# Patient Record
Sex: Male | Born: 1970 | Race: Black or African American | Hispanic: No | Marital: Married | Smoking: Never smoker
Health system: Southern US, Community
[De-identification: ages and names within clinical notes are randomized; demographics above are authoritative.]

## PROBLEM LIST (undated history)

## (undated) DIAGNOSIS — I10 Essential (primary) hypertension: Secondary | ICD-10-CM

## (undated) DIAGNOSIS — Z9889 Other specified postprocedural states: Secondary | ICD-10-CM

## (undated) DIAGNOSIS — J45909 Unspecified asthma, uncomplicated: Secondary | ICD-10-CM

## (undated) DIAGNOSIS — T4145XA Adverse effect of unspecified anesthetic, initial encounter: Secondary | ICD-10-CM

## (undated) DIAGNOSIS — R112 Nausea with vomiting, unspecified: Secondary | ICD-10-CM

## (undated) DIAGNOSIS — T8859XA Other complications of anesthesia, initial encounter: Secondary | ICD-10-CM

## (undated) HISTORY — DX: Unspecified asthma, uncomplicated: J45.909

---
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
## 2005-06-24 ENCOUNTER — Emergency Department: Payer: Self-pay | Admitting: Emergency Medicine

## 2006-04-11 ENCOUNTER — Emergency Department: Payer: Self-pay | Admitting: Unknown Physician Specialty

## 2009-10-27 ENCOUNTER — Emergency Department: Payer: Self-pay | Admitting: Emergency Medicine

## 2010-01-16 ENCOUNTER — Emergency Department: Payer: Self-pay | Admitting: Emergency Medicine

## 2011-03-12 ENCOUNTER — Emergency Department: Payer: Self-pay | Admitting: General Practice

## 2012-10-21 HISTORY — PX: KNEE ARTHROSCOPY: SUR90

## 2013-05-31 ENCOUNTER — Ambulatory Visit: Payer: Self-pay | Admitting: Specialist

## 2013-06-29 ENCOUNTER — Ambulatory Visit: Payer: Self-pay | Admitting: Specialist

## 2013-08-14 ENCOUNTER — Emergency Department: Payer: Self-pay | Admitting: Emergency Medicine

## 2013-09-28 ENCOUNTER — Ambulatory Visit: Payer: Self-pay | Admitting: Specialist

## 2013-09-28 LAB — POTASSIUM: Potassium: 4.1 mmol/L (ref 3.5–5.1)

## 2013-10-05 ENCOUNTER — Ambulatory Visit: Payer: Self-pay | Admitting: Specialist

## 2013-12-10 IMAGING — NM NM BONE LIMITED
1 series · 4 of 4 positions shown · non-contrast
Comparison: none

REASON FOR EXAM: abn MRI L  knee pain eval for stress fracture
COMMENTS:

[Series 1000: bone statics · 2.40mm/px · 2 acquisitions, 4 frames shown]
[im 1/2]
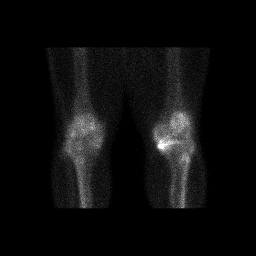
[im 1/2]
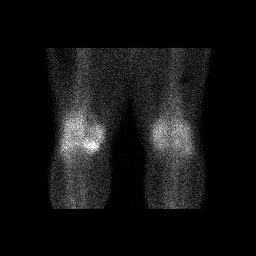
[im 2/2]
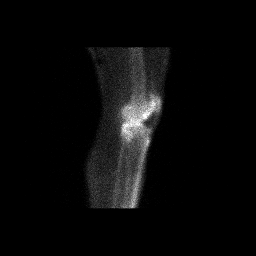
[im 2/2]
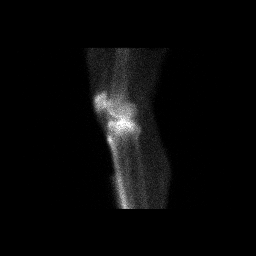

[4 of 4 positions shown; findings below may reference images not displayed]

PROCEDURE:     KNM - KNM LIMTED BONE SCAN SINGLE AREA  - June 29, 2013  [DATE]

RESULT:     A limited nuclear bone scan was performed over the knees.

The patient received 22.87 mCi of technetium 99 M labeled MDP for this study.

There is adequate uptake of the radiopharmaceutical by the skeleton.
Adequate soft tissue clearance is seen. There is increased uptake in the
medial aspect of the question of the posterior medial aspect of the tibial
metaphysis corresponding to a very mildly increased signal on the T2
weighted images in the medial tibial metaphysis. There is no abnormal uptake
in the area of clinical concern in the medial aspect of the
diaphyseal-metaphyseal junction of the distal left femur.
IMPRESSION: There is no abnormal uptake of the radiopharmaceutical in
the area of clinical concern in the diaphyseal-metaphyseal junction medially
in the left femur.

[REDACTED]

## 2015-02-10 NOTE — Op Note (Signed)
PATIENT NAME:  Bryan Stephenson, Bryan Stephenson MR#:  [id number] DATE OF BIRTH:  1971-02-02  DATE OF PROCEDURE:  10/05/2013  PREOPERATIVE DIAGNOSIS: Medial meniscus tear, left knee.   PREOPERATIVE DIAGNOSIS: Complex tear medial meniscus posterior horn, left knee.   PROCEDURE: Arthroscopic partial medial meniscectomy, left knee.   SURGEON: Myra Rudehristopher Cisar, M.D.   ANESTHESIA: General.   COMPLICATIONS: None.   ESTIMATED BLOOD LOSS: None.   DESCRIPTION OF PROCEDURE: After adequate induction of general anesthesia, the left lower extremity is placed in the leg holder in the usual manner for arthroscopy. The left knee and leg are thoroughly prepped with alcohol and ChloraPrep and draped in standard sterile fashion. The joint is infiltrated with 10 mL of Marcaine with epinephrine. Diagnostic arthroscopy is performed. The patellofemoral articulation is normal. There is minimal synovitis present. The anterior cruciate ligament is normal. The lateral compartment is normal with normal cartilaginous surfaces and a normal lateral meniscus. The pathology is confined to the medial compartment where there is seen to be a complex tear of the very posterior horn of the medial meniscus. There is mild chondromalacia of the tibial articular surface and the femoral condyle surface appears normal. Using a combination of the full radial resector and the ArthroWand, the torn portion of the meniscus is resected back to a stable rim. The probe was used to demonstrate that no further evidence of tear is seen. The joint is thoroughly irrigated multiple times until clear. Portals are closed with 5-0 nylon. The joint is infiltrated with 10 mL of Marcaine with epinephrine with 4 mg of morphine. A soft bulky dressing is applied. The patient is returned to the recovery room in satisfactory condition having tolerated the procedure quite well.  ____________________________ Clare Gandyhristopher E. Ruggles, MD ces:aw D: 10/05/2013 09:27:10  ET T: 10/05/2013 09:41:32 ET JOB#: [id number]  cc: Clare Gandyhristopher E. Beattie, MD, <Dictator> Clare GandyHRISTOPHER E Scrogham MD ELECTRONICALLY SIGNED 10/07/2013 12:32

## 2017-10-08 ENCOUNTER — Other Ambulatory Visit: Payer: Self-pay | Admitting: Orthopedic Surgery

## 2017-10-16 ENCOUNTER — Other Ambulatory Visit: Payer: Self-pay

## 2017-10-16 ENCOUNTER — Encounter
Admission: RE | Admit: 2017-10-16 | Discharge: 2017-10-16 | Disposition: A | Discharge status: Home / Self Care | Payer: Self-pay | Source: Ambulatory Visit | Attending: Orthopedic Surgery | Admitting: Orthopedic Surgery

## 2017-10-16 HISTORY — DX: Essential (primary) hypertension: I10

## 2017-10-16 HISTORY — DX: Other complications of anesthesia, initial encounter: T88.59XA

## 2017-10-16 HISTORY — DX: Adverse effect of unspecified anesthetic, initial encounter: T41.45XA

## 2017-10-16 HISTORY — DX: Other specified postprocedural states: Z98.890

## 2017-10-16 HISTORY — DX: Nausea with vomiting, unspecified: R11.2

## 2017-10-16 NOTE — Patient Instructions (Signed)
  Your procedure is scheduled on: 10-23-16 THURSDAY Report to Same Day Surgery 2nd floor medical mall [facility](Medical Mall Entrance-take elevator on left to 2nd floor.  Check in with surgery information desk.) To find out your arrival time please call [phone] between 1PM - 3PM [facility]  Remember: Instructions that are not followed completely may result in serious medical risk, up to and including death, or upon the discretion of your surgeon and anesthesiologist your surgery may need to be rescheduled.    _x___ 1. Do not eat food after midnight the night before your procedure. NO GUM OR CANDY AFTER MIDNIGHT.  You may drink clear liquids up to 2 hours before you are scheduled to arrive at the hospital for your procedure.  Do not drink clear liquids within 2 hours of your scheduled arrival to the hospital.  Clear liquids include  --Water or Apple juice without pulp  --Clear carbohydrate beverage such as ClearFast or Gatorade  --Black Coffee or Clear Tea (No milk, no creamers, do not add anything to  the coffee or Tea     __x__ 2. No Alcohol for 24 hours before or after surgery.   __x__3. No Smoking for 24 prior to surgery.   ____  4. Bring all medications with you on the day of surgery if instructed.    __x__ 5. Notify your doctor if there is any change in your medical condition     (cold, fever, infections).     Do not wear jewelry, make-up, hairpins, clips or nail polish.  Do not wear lotions, powders, or perfumes. You may wear deodorant.  Do not shave 48 hours prior to surgery. Men may shave face and neck.  Do not bring valuables to the hospital.    [facility] any belongings or valuables.               Contacts, dentures or bridgework may not be worn into surgery.  Leave your suitcase in the car. After surgery it may be brought to your room.  For patients admitted to the hospital, discharge time is determined by your treatment team.   Patients  discharged the day of surgery will not be allowed to drive home.  You will need someone to drive you home and stay with you the night of your procedure.    Please read over the following fact sheets that you were given:   [facility] Pre[facility] and or MRSA Information   ____ TAKE THE FOLLOWING MEDICATION THE MORNING OF SURGERY WITH A SMALL SIP OF WATER. These include:  1. NONE  2.  3.  4.  5.  6.  ____Fleets enema or Magnesium Citrate as directed.   ____ Use CHG Soap or sage wipes as directed on instruction sheet   ____ Use inhalers on the day of surgery and bring to hospital day of surgery  ____ Stop Metformin and Janumet 2 days prior to surgery.    ____ Take 1/2 of usual insulin dose the night before surgery and none on the morning surgery.   ____ Follow recommendations from Cardiologist, Pulmonologist or PCP regarding stopping Aspirin, Coumadin, Plavix ,Eliquis, Effient, or Pradaxa, and Pletal.  X____Stop Anti-inflammatories such as Advil, Aleve, Ibuprofen, Motrin, Naproxen, Naprosyn, Goodies powders or aspirin products NOW-OK to take Tylenol    ____ Stop supplements until after surgery.     ____ Bring C-Pap to the hospital.

## 2017-10-20 ENCOUNTER — Inpatient Hospital Stay: Admission: RE | Admit: 2017-10-20 | Payer: Self-pay | Source: Ambulatory Visit

## 2017-10-22 ENCOUNTER — Encounter
Admission: RE | Admit: 2017-10-22 | Discharge: 2017-10-22 | Disposition: A | Discharge status: Home / Self Care | Payer: Worker's Compensation | Source: Ambulatory Visit | Attending: Orthopedic Surgery | Admitting: Orthopedic Surgery

## 2017-10-22 ENCOUNTER — Other Ambulatory Visit: Payer: Self-pay

## 2017-10-22 DIAGNOSIS — S83241A Other tear of medial meniscus, current injury, right knee, initial encounter: Secondary | ICD-10-CM | POA: Diagnosis present

## 2017-10-22 DIAGNOSIS — Z79899 Other long term (current) drug therapy: Secondary | ICD-10-CM | POA: Diagnosis not present

## 2017-10-22 DIAGNOSIS — E669 Obesity, unspecified: Secondary | ICD-10-CM | POA: Diagnosis not present

## 2017-10-22 DIAGNOSIS — I10 Essential (primary) hypertension: Secondary | ICD-10-CM | POA: Diagnosis not present

## 2017-10-22 DIAGNOSIS — Z6833 Body mass index (BMI) 33.0-33.9, adult: Secondary | ICD-10-CM | POA: Diagnosis not present

## 2017-10-22 DIAGNOSIS — X58XXXA Exposure to other specified factors, initial encounter: Secondary | ICD-10-CM | POA: Diagnosis not present

## 2017-10-22 LAB — BASIC METABOLIC PANEL
Anion gap: 8 (ref 5–15)
BUN: 16 mg/dL (ref 6–20)
CALCIUM: 9.1 mg/dL (ref 8.9–10.3)
CO2: 27 mmol/L (ref 22–32)
CREATININE: 1.18 mg/dL (ref 0.61–1.24)
Chloride: 105 mmol/L (ref 101–111)
GFR calc non Af Amer: >60 mL/min (ref >60)
Glucose, Bld: 96 mg/dL (ref 65–99)
Potassium: 4.1 mmol/L (ref 3.5–5.1)
SODIUM: 140 mmol/L (ref 135–145)

## 2017-10-22 LAB — CBC WITH DIFFERENTIAL/PLATELET
BASOS PCT: 1 %
Basophils Absolute: 0.1 10*3/uL (ref 0–0.1)
EOS ABS: 0.3 10*3/uL (ref 0–0.7)
EOS PCT: 4 %
HCT: 41.7 % (ref 40.0–52.0)
HEMOGLOBIN: 13.7 g/dL (ref 13.0–18.0)
Lymphocytes Relative: 26 %
Lymphs Abs: 1.9 10*3/uL (ref 1.0–3.6)
MCH: 27.8 pg (ref 26.0–34.0)
MCHC: 32.9 g/dL (ref 32.0–36.0)
MCV: 84.4 fL (ref 80.0–100.0)
MONOS PCT: 6 %
Monocytes Absolute: 0.4 10*3/uL (ref 0.2–1.0)
NEUTROS PCT: 63 %
Neutro Abs: 4.4 10*3/uL (ref 1.4–6.5)
PLATELETS: 239 10*3/uL (ref 150–440)
RBC: 4.94 MIL/uL (ref 4.40–5.90)
RDW: 12.5 % (ref 11.5–14.5)
WBC: 7.1 10*3/uL (ref 3.8–10.6)

## 2017-10-22 LAB — PROTIME-INR
INR: 1.05
PROTHROMBIN TIME: 13.6 s (ref 11.4–15.2)

## 2017-10-22 LAB — APTT: aPTT: 32 seconds (ref 24–36)

## 2017-10-22 MED ORDER — CEFAZOLIN SODIUM-DEXTROSE 2-4 GM/100ML-% IV SOLN
2.0000 g | INTRAVENOUS | Status: AC
  Administered 2017-10-23: 2 g via INTRAVENOUS

## 2017-10-23 ENCOUNTER — Ambulatory Visit
Admission: RE | Admit: 2017-10-23 | Discharge: 2017-10-23 | Disposition: A | Discharge status: Home / Self Care | Payer: Worker's Compensation | Source: Ambulatory Visit | Attending: Orthopedic Surgery | Admitting: Orthopedic Surgery

## 2017-10-23 ENCOUNTER — Ambulatory Visit: Payer: Worker's Compensation | Admitting: Anesthesiology

## 2017-10-23 ENCOUNTER — Encounter
Admission: RE | Disposition: A | Discharge status: Home / Self Care | Payer: Self-pay | Source: Ambulatory Visit | Attending: Orthopedic Surgery

## 2017-10-23 ENCOUNTER — Encounter: Payer: Self-pay | Admitting: *Deleted

## 2017-10-23 DIAGNOSIS — I10 Essential (primary) hypertension: Secondary | ICD-10-CM | POA: Insufficient documentation

## 2017-10-23 DIAGNOSIS — X58XXXA Exposure to other specified factors, initial encounter: Secondary | ICD-10-CM | POA: Insufficient documentation

## 2017-10-23 DIAGNOSIS — E669 Obesity, unspecified: Secondary | ICD-10-CM | POA: Insufficient documentation

## 2017-10-23 DIAGNOSIS — S83241A Other tear of medial meniscus, current injury, right knee, initial encounter: Secondary | ICD-10-CM | POA: Insufficient documentation

## 2017-10-23 DIAGNOSIS — Z79899 Other long term (current) drug therapy: Secondary | ICD-10-CM | POA: Insufficient documentation

## 2017-10-23 DIAGNOSIS — Z6833 Body mass index (BMI) 33.0-33.9, adult: Secondary | ICD-10-CM | POA: Insufficient documentation

## 2017-10-23 HISTORY — PX: KNEE ARTHROSCOPY WITH MEDIAL MENISECTOMY: SHX5651

## 2017-10-23 SURGERY — ARTHROSCOPY, KNEE, WITH MEDIAL MENISCECTOMY
Anesthesia: General | Site: Knee | Laterality: Right | Wound class: Clean

## 2017-10-23 MED ORDER — ONDANSETRON HCL 4 MG/2ML IJ SOLN
INTRAMUSCULAR | Status: AC
  Filled 2017-10-23: qty 2

## 2017-10-23 MED ORDER — PROMETHAZINE HCL 25 MG/ML IJ SOLN: 6.2500 mg | INTRAMUSCULAR | Status: DC | PRN

## 2017-10-23 MED ORDER — MIDAZOLAM HCL 5 MG/5ML IJ SOLN
INTRAMUSCULAR | Status: DC | PRN
  Administered 2017-10-23: 2 mg via INTRAVENOUS

## 2017-10-23 MED ORDER — MIDAZOLAM HCL 2 MG/2ML IJ SOLN
INTRAMUSCULAR | Status: AC
  Filled 2017-10-23: qty 2

## 2017-10-23 MED ORDER — FENTANYL CITRATE (PF) 100 MCG/2ML IJ SOLN
INTRAMUSCULAR | Status: AC
  Filled 2017-10-23: qty 2

## 2017-10-23 MED ORDER — LIDOCAINE HCL (PF) 1 % IJ SOLN
INTRAMUSCULAR | Status: AC
  Filled 2017-10-23: qty 30

## 2017-10-23 MED ORDER — DEXAMETHASONE SODIUM PHOSPHATE 10 MG/ML IJ SOLN
INTRAMUSCULAR | Status: DC | PRN
  Administered 2017-10-23: 10 mg via INTRAVENOUS

## 2017-10-23 MED ORDER — OXYCODONE HCL 5 MG/5ML PO SOLN: 5.0000 mg | Freq: Once | ORAL | Status: DC | PRN

## 2017-10-23 MED ORDER — BUPIVACAINE-EPINEPHRINE (PF) 0.25% -1:200000 IJ SOLN
INTRAMUSCULAR | Status: DC | PRN
  Administered 2017-10-23: 30 mL

## 2017-10-23 MED ORDER — PROPOFOL 10 MG/ML IV BOLUS
INTRAVENOUS | Status: AC
  Filled 2017-10-23: qty 20

## 2017-10-23 MED ORDER — FAMOTIDINE 20 MG PO TABS
20.0000 mg | ORAL_TABLET | Freq: Once | ORAL | Status: AC
  Administered 2017-10-23: 20 mg via ORAL

## 2017-10-23 MED ORDER — HYDROCODONE-ACETAMINOPHEN 5-325 MG PO TABS: 1.0000 | ORAL_TABLET | ORAL | 0 refills | Status: DC | PRN

## 2017-10-23 MED ORDER — LIDOCAINE 2% (20 MG/ML) 5 ML SYRINGE
INTRAMUSCULAR | Status: DC | PRN
  Administered 2017-10-23: 100 mg via INTRAVENOUS

## 2017-10-23 MED ORDER — FAMOTIDINE 20 MG PO TABS
ORAL_TABLET | ORAL | Status: AC
  Administered 2017-10-23: 20 mg via ORAL
  Filled 2017-10-23: qty 1

## 2017-10-23 MED ORDER — LIDOCAINE HCL (PF) 1 % IJ SOLN
INTRAMUSCULAR | Status: DC | PRN
  Administered 2017-10-23: 13 mL

## 2017-10-23 MED ORDER — SCOPOLAMINE 1 MG/3DAYS TD PT72
MEDICATED_PATCH | TRANSDERMAL | Status: AC
  Administered 2017-10-23: 1.5 mg
  Filled 2017-10-23: qty 1

## 2017-10-23 MED ORDER — ACETAMINOPHEN 10 MG/ML IV SOLN
INTRAVENOUS | Status: AC
  Filled 2017-10-23: qty 100

## 2017-10-23 MED ORDER — PROPOFOL 10 MG/ML IV BOLUS
INTRAVENOUS | Status: DC | PRN
  Administered 2017-10-23: 200 mg via INTRAVENOUS
  Administered 2017-10-23: 50 mg via INTRAVENOUS

## 2017-10-23 MED ORDER — BUPIVACAINE-EPINEPHRINE (PF) 0.25% -1:200000 IJ SOLN
INTRAMUSCULAR | Status: AC
  Filled 2017-10-23: qty 30

## 2017-10-23 MED ORDER — ONDANSETRON HCL 4 MG/2ML IJ SOLN
INTRAMUSCULAR | Status: DC | PRN
  Administered 2017-10-23: 4 mg via INTRAVENOUS

## 2017-10-23 MED ORDER — GLYCOPYRROLATE 0.2 MG/ML IJ SOLN
INTRAMUSCULAR | Status: AC
  Filled 2017-10-23: qty 1

## 2017-10-23 MED ORDER — LACTATED RINGERS IV SOLN
INTRAVENOUS | Status: DC
  Administered 2017-10-23: 50 mL/h via INTRAVENOUS

## 2017-10-23 MED ORDER — LIDOCAINE HCL (PF) 2 % IJ SOLN
INTRAMUSCULAR | Status: AC
  Filled 2017-10-23: qty 10

## 2017-10-23 MED ORDER — CHLORHEXIDINE GLUCONATE CLOTH 2 % EX PADS: 6.0000 | MEDICATED_PAD | Freq: Once | CUTANEOUS | Status: DC

## 2017-10-23 MED ORDER — KETOROLAC TROMETHAMINE 30 MG/ML IJ SOLN
INTRAMUSCULAR | Status: DC | PRN
  Administered 2017-10-23: 30 mg via INTRAVENOUS

## 2017-10-23 MED ORDER — OXYCODONE HCL 5 MG PO TABS: 5.0000 mg | ORAL_TABLET | Freq: Once | ORAL | Status: DC | PRN

## 2017-10-23 MED ORDER — ONDANSETRON HCL 4 MG PO TABS: 4.0000 mg | ORAL_TABLET | Freq: Three times a day (TID) | ORAL | 0 refills | Status: DC | PRN

## 2017-10-23 MED ORDER — PROPOFOL 500 MG/50ML IV EMUL
INTRAVENOUS | Status: DC | PRN
  Administered 2017-10-23: 125 ug/kg/min via INTRAVENOUS

## 2017-10-23 MED ORDER — CEFAZOLIN SODIUM-DEXTROSE 2-4 GM/100ML-% IV SOLN
INTRAVENOUS | Status: AC
  Filled 2017-10-23: qty 100

## 2017-10-23 MED ORDER — PROPOFOL 500 MG/50ML IV EMUL
INTRAVENOUS | Status: AC
  Filled 2017-10-23: qty 100

## 2017-10-23 MED ORDER — KETOROLAC TROMETHAMINE 30 MG/ML IJ SOLN
INTRAMUSCULAR | Status: AC
  Filled 2017-10-23: qty 1

## 2017-10-23 MED ORDER — BISOPROLOL FUMARATE 5 MG PO TABS
5.0000 mg | ORAL_TABLET | Freq: Once | ORAL | Status: AC
  Administered 2017-10-23: 5 mg via ORAL
  Filled 2017-10-23: qty 1

## 2017-10-23 MED ORDER — MEPERIDINE HCL 50 MG/ML IJ SOLN: 6.2500 mg | INTRAMUSCULAR | Status: DC | PRN

## 2017-10-23 MED ORDER — ASPIRIN EC 325 MG PO TBEC: 325.0000 mg | DELAYED_RELEASE_TABLET | Freq: Every day | ORAL | 0 refills | Status: DC

## 2017-10-23 MED ORDER — FENTANYL CITRATE (PF) 100 MCG/2ML IJ SOLN: 25.0000 ug | INTRAMUSCULAR | Status: DC | PRN

## 2017-10-23 MED ORDER — ACETAMINOPHEN 10 MG/ML IV SOLN
INTRAVENOUS | Status: DC | PRN
  Administered 2017-10-23: 1000 mg via INTRAVENOUS

## 2017-10-23 MED ORDER — DEXAMETHASONE SODIUM PHOSPHATE 10 MG/ML IJ SOLN
INTRAMUSCULAR | Status: AC
  Filled 2017-10-23: qty 1

## 2017-10-23 MED ORDER — DEXMEDETOMIDINE HCL 200 MCG/2ML IV SOLN
INTRAVENOUS | Status: DC | PRN
  Administered 2017-10-23: 8 ug via INTRAVENOUS

## 2017-10-23 MED ORDER — GLYCOPYRROLATE 0.2 MG/ML IJ SOLN
INTRAMUSCULAR | Status: DC | PRN
  Administered 2017-10-23: 0.2 mg via INTRAVENOUS

## 2017-10-23 MED ORDER — FENTANYL CITRATE (PF) 100 MCG/2ML IJ SOLN
INTRAMUSCULAR | Status: DC | PRN
  Administered 2017-10-23 (×3): 50 ug via INTRAVENOUS

## 2017-10-23 MED ORDER — SCOPOLAMINE 1 MG/3DAYS TD PT72: 1.0000 | MEDICATED_PATCH | TRANSDERMAL | Status: DC

## 2017-10-23 SURGICAL SUPPLY — 43 items
ADAPTER IRRIG TUBE 2 SPIKE SOL (ADAPTER) ×6 IMPLANT
BUR RADIUS 3.5 (BURR) ×3 IMPLANT
BUR RADIUS 4.0X18.5 (BURR) ×3 IMPLANT
CANISTER SUCT LVC 12 LTR MEDI- (MISCELLANEOUS) IMPLANT
CHLORAPREP W/TINT 26ML (MISCELLANEOUS) ×9 IMPLANT
CLOSURE WOUND 1/2 X4 (GAUZE/BANDAGES/DRESSINGS) ×1
COOLER POLAR GLACIER W/PUMP (MISCELLANEOUS) IMPLANT
CUFF TOURN 24 STER (MISCELLANEOUS) IMPLANT
CUFF TOURN 30 STER DUAL PORT (MISCELLANEOUS) ×3 IMPLANT
DEVICE SUCT BLK HOLE OR FLOOR (MISCELLANEOUS) IMPLANT
DRAPE IMP U-DRAPE 54X76 (DRAPES) ×3 IMPLANT
DURAPREP 26ML APPLICATOR (WOUND CARE) IMPLANT
GAUZE PETRO XEROFOAM 1X8 (MISCELLANEOUS) IMPLANT
GAUZE SPONGE 4X4 12PLY STRL (GAUZE/BANDAGES/DRESSINGS) ×3 IMPLANT
GLOVE BIOGEL PI IND STRL 7.0 (GLOVE) ×4 IMPLANT
GLOVE BIOGEL PI IND STRL 9 (GLOVE) ×1 IMPLANT
GLOVE BIOGEL PI INDICATOR 7.0 (GLOVE) ×8
GLOVE BIOGEL PI INDICATOR 9 (GLOVE) ×2
GLOVE SURG 9.0 ORTHO LTXF (GLOVE) ×6 IMPLANT
GOWN STRL REUS TWL 2XL XL LVL4 (GOWN DISPOSABLE) ×3 IMPLANT
GOWN STRL REUS W/ TWL LRG LVL3 (GOWN DISPOSABLE) ×1 IMPLANT
GOWN STRL REUS W/TWL LRG LVL3 (GOWN DISPOSABLE) ×2
IV LACTATED RINGER IRRG 3000ML (IV SOLUTION) ×20
IV LR IRRIG 3000ML ARTHROMATIC (IV SOLUTION) ×10 IMPLANT
KIT RM TURNOVER STRD PROC AR (KITS) ×3 IMPLANT
MANIFOLD NEPTUNE II (INSTRUMENTS) ×3 IMPLANT
MAT BLUE FLOOR 46X72 FLO (MISCELLANEOUS) ×6 IMPLANT
NEEDLE HYPO 22GX1.5 SAFETY (NEEDLE) ×3 IMPLANT
PACK ARTHROSCOPY KNEE (MISCELLANEOUS) ×3 IMPLANT
PAD ABD DERMACEA PRESS 5X9 (GAUZE/BANDAGES/DRESSINGS) ×6 IMPLANT
PAD WRAPON POLAR KNEE (MISCELLANEOUS) ×1 IMPLANT
SET TUBE SUCT SHAVER OUTFL 24K (TUBING) ×3 IMPLANT
SET TUBE TIP INTRA-ARTICULAR (MISCELLANEOUS) ×3 IMPLANT
SOL PREP PVP 2OZ (MISCELLANEOUS)
SOLUTION PREP PVP 2OZ (MISCELLANEOUS) IMPLANT
STRIP CLOSURE SKIN 1/2X4 (GAUZE/BANDAGES/DRESSINGS) ×2 IMPLANT
SUT ETHILON 4-0 (SUTURE) ×2
SUT ETHILON 4-0 FS2 18XMFL BLK (SUTURE) ×1
SUTURE ETHLN 4-0 FS2 18XMF BLK (SUTURE) ×1 IMPLANT
TUBING ARTHRO INFLOW-ONLY STRL (TUBING) ×3 IMPLANT
WAND HAND CNTRL MULTIVAC 50 (MISCELLANEOUS) IMPLANT
WAND HAND CNTRL MULTIVAC 90 (MISCELLANEOUS) ×3 IMPLANT
WRAPON POLAR PAD KNEE (MISCELLANEOUS) ×3

## 2017-10-23 NOTE — H&P (Signed)
The patient has been re-examined, and the chart reviewed, and there have been no interval changes to the documented history and physical.    The risks, benefits, and alternatives have been discussed at length, and the patient is willing to proceed.   

## 2017-10-23 NOTE — Op Note (Signed)
PATIENT:  Bryan Stephenson  PRE-OPERATIVE DIAGNOSIS:  TEAR OF MEDIAL MENISCUS, RIGHT KNEE  POST-OPERATIVE DIAGNOSIS:    PROCEDURE:  RIGHT KNEE ARTHROSCOPY WITH  Partial MEDIAL AND LATERAL MENISECTOMIES, SYNOVECTOMY AND CHONDROPLASTY OF THE UNDERSURFACE OF THE PATELLA, TROCHLEA AND MEDIAL FEMORAL CONDYLE  SURGEON:  Thornton Park, MD  ANESTHESIA:   General  PREOPERATIVE INDICATIONS:  Bryan Stephenson  47 y.o. male with a diagnosis of TEAR OF MEDIAL MENISCUS, RIGHT KNEE who failed conservative management and elected for surgical management.    The risks benefits and alternatives were discussed with the patient preoperatively including the risks of infection, bleeding, nerve injury, knee stiffness, persistent pain, osteoarthritis and the need for further surgery. Medical  risks include DVT and pulmonary embolism, myocardial infarction, stroke, pneumonia, respiratory failure and death. The patient understood these risks and wished to proceed.  OPERATIVE FINDINGS: Tear of the posterior horn of the medial meniscus, extensive chondral wear of the femoral trochlea, chondral fraying of the undersurface of the patella and small chondral fissuring involving the medial femoral condyle.  OPERATIVE PROCEDURE: Patient was met in the preoperative area. His wife was at the bedside. The operative extremity was signed with the word yes and my initials according the hospital's correct site of surgery protocol.  I answered all their questions.  The patient was brought to the operating room where they was placed supine on the operative table. General anesthesia was administered. The patient was prepped and draped in a sterile fashion.  A timeout was performed to verify the patient's name, date of birth, medical record number, correct site of surgery correct procedure to be performed. It was also used to verify the patient received antibiotics that all appropriate instruments, and radiographic studies were  available in the room. Once all in attendance were in agreement, the case began.  Proposed arthroscopy incisions were drawn out with a surgical marker. These were pre-injected with 1% lidocaine plain. An 11 blade was used to establish an inferior lateral and inferomedial portals. The inferomedial portal was created using a 18-gauge spinal needle under direct visualization.  A full diagnostic examination of the knee was performed including the suprapatellar pouch, patellofemoral joint, medial lateral compartments as well as the medial lateral gutters, the intercondylar notch in the posterior knee.  Patient had the medial meniscal tear treated with a 4-0 resector shaver blade and straight and upbiting duckbill baskets. The meniscus was debrided until a stable rim was achieved. The leg was then placed in a figure 4 position. The tibial plateau was found to have chondral fraying and was debrided with a 4 resector shaver blade as was the fraying of the lateral meniscus.   A chondroplasty of the medial femoral condyle, trochlea and undersurface of patella was also performed using a 4-0 resector shaver blade. An extensive synovectomy was also performed using a 4-0 resector shaver blade and 90 ArthroCare wand.  The knee was then copiously lavaged. All arthroscopic instruments were removed. The 2 arthroscopy portals were closed with 4-0 nylon. Steri-Strips were applied along with a dry sterile and compressive dressing. The patient was brought to the PACU in stable condition. An intra-articular injection of cortisone percent Marcaine with epi was injected to assist with postop pain control.   I was scrubbed and present for the entire case and all sharp and instrument counts were correct at the conclusion the case. I spoke with the patient's wife and mother postoperatively to let them know the case was performed without complication and the  patient was stable in the recovery room.    Bryan Gaul,  MD

## 2017-10-23 NOTE — Anesthesia Postprocedure Evaluation (Signed)
Anesthesia Post Note  Patient: Bryan Stephenson  Procedure(s) Performed: KNEE ARTHROSCOPY WITH MEDIAL MENISECTOMY (Right Knee)  Patient location during evaluation: PACU Anesthesia Type: General Level of consciousness: awake and alert and oriented Pain management: pain level controlled Vital Signs Assessment: post-procedure vital signs reviewed and stable Respiratory status: spontaneous breathing, nonlabored ventilation and respiratory function stable Cardiovascular status: blood pressure returned to baseline and stable Postop Assessment: no signs of nausea or vomiting Anesthetic complications: no     Last Vitals:  Vitals:   10/23/17 0953 10/23/17 1015  BP: 122/71 115/88  Pulse: 80 81  Resp: 16 16  Temp: (!) 36.3 C   SpO2: 100% 100%    Last Pain:  Vitals:   10/23/17 1015  TempSrc:   PainSc: 0-No pain                 Mackensi Mahadeo

## 2017-10-23 NOTE — Transfer of Care (Signed)
Immediate Anesthesia Transfer of Care Note  Patient: Bryan Stephenson  Procedure(s) Performed: KNEE ARTHROSCOPY WITH MEDIAL MENISECTOMY (Right Knee)  Patient Location: PACU  Anesthesia Type:General  Level of Consciousness: awake and alert   Airway & Oxygen Therapy: Patient Spontanous Breathing and Patient connected to face mask oxygen  Post-op Assessment: Report given to RN and Post -op Vital signs reviewed and stable  Post vital signs: Reviewed and stable  Last Vitals:  Vitals:   10/23/17 0634  BP: 133/80  Pulse: (!) 52  Resp: 15  Temp: 36.6 C  SpO2: 100%    Last Pain:  Vitals:   10/23/17 0634  TempSrc: Tympanic      Patients Stated Pain Goal: 0 (10/23/17 [id number])  Complications: No apparent anesthesia complications

## 2017-10-23 NOTE — Anesthesia Post-op Follow-up Note (Signed)
Anesthesia QCDR form completed.        

## 2017-10-23 NOTE — Anesthesia Preprocedure Evaluation (Signed)
Anesthesia Evaluation  Patient identified by MRN, date of birth, ID band Patient awake    Reviewed: Allergy & Precautions, NPO status , Patient's Chart, lab work & pertinent test results  History of Anesthesia Complications (+) PONV and history of anesthetic complications  Airway Mallampati: III  TM Distance: >3 FB Neck ROM: Full    Dental  (+)    Pulmonary neg pulmonary ROS, neg sleep apnea, neg COPD,    breath sounds clear to auscultation- rhonchi (-) wheezing      Cardiovascular Exercise Tolerance: Good hypertension, Pt. on medications (-) CAD, (-) Past MI, (-) Cardiac Stents and (-) CABG  Rhythm:Regular Rate:Normal - Systolic murmurs and - Diastolic murmurs    Neuro/Psych negative neurological ROS  negative psych ROS   GI/Hepatic negative GI ROS, Neg liver ROS,   Endo/Other  negative endocrine ROSneg diabetes  Renal/GU negative Renal ROS     Musculoskeletal negative musculoskeletal ROS (+)   Abdominal (+) + obese,   Peds  Hematology negative hematology ROS (+)   Anesthesia Other Findings Past Medical History: No date: Complication of anesthesia No date: Hypertension No date: PONV (postoperative nausea and vomiting)     Comment:  "violent" vomitting   Reproductive/Obstetrics                             Anesthesia Physical Anesthesia Plan  ASA: II  Anesthesia Plan: General   Post-op Pain Management:    Induction: Intravenous  PONV Risk Score and Plan: 2 and Propofol infusion, Ondansetron, Midazolam and TIVA  Airway Management Planned: LMA  Additional Equipment:   Intra-op Plan:   Post-operative Plan:   Informed Consent: I have reviewed the patients History and Physical, chart, labs and discussed the procedure including the risks, benefits and alternatives for the proposed anesthesia with the patient or authorized representative who has indicated his/her understanding  and acceptance.   Dental advisory given  Plan Discussed with: CRNA and Anesthesiologist  Anesthesia Plan Comments:         Anesthesia Quick Evaluation

## 2017-10-23 NOTE — Discharge Instructions (Signed)

## 2017-10-23 NOTE — Anesthesia Procedure Notes (Signed)
Procedure Name: LMA Insertion Date/Time: 10/23/2017 8:55 AM Performed by: Paulette BlanchParas, Shylynn Bruning, CRNA Pre-anesthesia Checklist: Patient identified, Patient being monitored, Timeout performed, Emergency Drugs available and Suction available Patient Re-evaluated:Patient Re-evaluated prior to induction Oxygen Delivery Method: Circle system utilized Preoxygenation: Pre-oxygenation with 100% oxygen Induction Type: IV induction Ventilation: Mask ventilation without difficulty LMA: LMA inserted LMA Size: 5.0 Tube type: Oral Number of attempts: 1 Placement Confirmation: positive ETCO2 and breath sounds checked- equal and bilateral Tube secured with: Tape Dental Injury: Teeth and Oropharynx as per pre-operative assessment

## 2019-04-29 DIAGNOSIS — R69 Illness, unspecified: Secondary | ICD-10-CM | POA: Diagnosis not present

## 2019-07-12 DIAGNOSIS — R69 Illness, unspecified: Secondary | ICD-10-CM | POA: Diagnosis not present

## 2019-09-06 DIAGNOSIS — R69 Illness, unspecified: Secondary | ICD-10-CM | POA: Diagnosis not present

## 2019-09-20 DIAGNOSIS — R69 Illness, unspecified: Secondary | ICD-10-CM | POA: Diagnosis not present

## 2019-09-28 DIAGNOSIS — R69 Illness, unspecified: Secondary | ICD-10-CM | POA: Diagnosis not present

## 2019-09-29 ENCOUNTER — Other Ambulatory Visit: Payer: Self-pay

## 2019-09-29 DIAGNOSIS — I10 Essential (primary) hypertension: Secondary | ICD-10-CM

## 2019-09-29 MED ORDER — BISOPROLOL-HYDROCHLOROTHIAZIDE 5-6.25 MG PO TABS: 2.0000 | ORAL_TABLET | ORAL | 1 refills | Status: DC

## 2019-10-01 ENCOUNTER — Other Ambulatory Visit: Payer: Self-pay

## 2019-10-01 ENCOUNTER — Ambulatory Visit: Payer: 59

## 2019-10-01 DIAGNOSIS — Z0000 Encounter for general adult medical examination without abnormal findings: Secondary | ICD-10-CM

## 2019-10-01 LAB — POCT URINALYSIS DIPSTICK
Bilirubin, UA: NEGATIVE
Blood, UA: NEGATIVE
Glucose, UA: NEGATIVE
Ketones, UA: NEGATIVE
Leukocytes, UA: NEGATIVE
Nitrite, UA: NEGATIVE
Protein, UA: NEGATIVE
Spec Grav, UA: >=1.03 — AB (ref 1.010–1.025)
Urobilinogen, UA: 0.2 E.U./dL
pH, UA: 5.5 (ref 5.0–8.0)

## 2019-10-01 NOTE — Progress Notes (Signed)
Scheduled to complete physical with  Ron Ofarrell, PA-C (Interim Provider).  AMD 

## 2019-10-02 LAB — CMP12+LP+TP+TSH+6AC+PSA+CBC…
ALT: 20 IU/L (ref 0–44)
AST: 28 IU/L (ref 0–40)
Alkaline Phosphatase: 57 IU/L (ref 39–117)
Basophils Absolute: 0.1 10*3/uL (ref 0.0–0.2)
Basos: 1 %
Bilirubin Total: 1 mg/dL (ref 0.0–1.2)
Calcium: 9.2 mg/dL (ref 8.7–10.2)
Chloride: 103 mmol/L (ref 96–106)
EOS (ABSOLUTE): 0.1 10*3/uL (ref 0.0–0.4)
Estimated CHD Risk: 0.6 times avg. (ref 0.0–1.0)
GFR calc Af Amer: 76 mL/min/{1.73_m2} (ref >59)
GFR calc non Af Amer: 66 mL/min/{1.73_m2} (ref >59)
GGT: 16 IU/L (ref 0–65)
Immature Granulocytes: 1 %
Iron: 110 ug/dL (ref 38–169)
Lymphocytes Absolute: 1.6 10*3/uL (ref 0.7–3.1)
Lymphs: 27 %
MCH: 27.9 pg (ref 26.6–33.0)
MCHC: 33.3 g/dL (ref 31.5–35.7)
Neutrophils: 61 %
Prostate Specific Ag, Serum: 1.2 ng/mL (ref 0.0–4.0)
RBC: 4.84 x10E6/uL (ref 4.14–5.80)
TSH: 2.7 u[IU]/mL (ref 0.450–4.500)
Total Protein: 6.8 g/dL (ref 6.0–8.5)
Triglycerides: 67 mg/dL (ref 0–149)

## 2019-10-02 LAB — CMP12+LP+TP+TSH+6AC+PSA+CBC?
Albumin/Globulin Ratio: 2 (ref 1.2–2.2)
Albumin: 4.5 g/dL (ref 4.0–5.0)
BUN/Creatinine Ratio: 18 (ref 9–20)
BUN: 23 mg/dL (ref 6–24)
Chol/HDL Ratio: 3.5 ratio (ref 0.0–5.0)
Cholesterol, Total: 204 mg/dL — ABNORMAL HIGH (ref 100–199)
Creatinine, Ser: 1.26 mg/dL (ref 0.76–1.27)
Eos: 2 %
Free Thyroxine Index: 1.5 (ref 1.2–4.9)
Globulin, Total: 2.3 g/dL (ref 1.5–4.5)
Glucose: 89 mg/dL (ref 65–99)
HDL: 58 mg/dL (ref >39)
Hematocrit: 40.6 % (ref 37.5–51.0)
Hemoglobin: 13.5 g/dL (ref 13.0–17.7)
Immature Grans (Abs): 0 10*3/uL (ref 0.0–0.1)
LDH: 243 IU/L — ABNORMAL HIGH (ref 121–224)
LDL Chol Calc (NIH): 134 mg/dL — ABNORMAL HIGH (ref 0–99)
MCV: 84 fL (ref 79–97)
Monocytes Absolute: 0.5 10*3/uL (ref 0.1–0.9)
Monocytes: 8 %
Neutrophils Absolute: 3.7 10*3/uL (ref 1.4–7.0)
Phosphorus: 3.4 mg/dL (ref 2.8–4.1)
Platelets: 242 10*3/uL (ref 150–450)
Potassium: 3.9 mmol/L (ref 3.5–5.2)
RDW: 11.7 % (ref 11.6–15.4)
Sodium: 138 mmol/L (ref 134–144)
T3 Uptake Ratio: 25 % (ref 24–39)
T4, Total: 6 ug/dL (ref 4.5–12.0)
Uric Acid: 6.6 mg/dL (ref 3.8–8.4)
VLDL Cholesterol Cal: 12 mg/dL (ref 5–40)
WBC: 6.1 10*3/uL (ref 3.4–10.8)

## 2019-10-04 DIAGNOSIS — Z20828 Contact with and (suspected) exposure to other viral communicable diseases: Secondary | ICD-10-CM | POA: Diagnosis not present

## 2019-10-06 DIAGNOSIS — R69 Illness, unspecified: Secondary | ICD-10-CM | POA: Diagnosis not present

## 2019-10-13 DIAGNOSIS — R69 Illness, unspecified: Secondary | ICD-10-CM | POA: Diagnosis not present

## 2019-10-18 DIAGNOSIS — R69 Illness, unspecified: Secondary | ICD-10-CM | POA: Diagnosis not present

## 2019-10-19 ENCOUNTER — Other Ambulatory Visit: Payer: Self-pay

## 2019-10-19 ENCOUNTER — Ambulatory Visit: Payer: 59 | Admitting: Physician Assistant

## 2019-10-19 VITALS — BP 120/90 | HR 58 | Temp 97.8°F | Ht 70.0 in | Wt 240.0 lb

## 2019-10-19 DIAGNOSIS — Z0000 Encounter for general adult medical examination without abnormal findings: Secondary | ICD-10-CM

## 2019-10-19 NOTE — Progress Notes (Signed)
   Subjective: Annual exam    Patient ID: Bryan Stephenson, male    DOB: [birth year], 48 y.o.   MRN: [id number]  HPI Patient presents for annual physical exam.  Patient voices no concerns.   Review of Systems Hypertension    Objective:   Physical Exam No acute distress.  HEENT is unremarkable.  Neck is supple without adenopathy or bruits.  Lungs are clear to auscultation.  Heart is bradycardic with a regular rate and rhythm.  Abdomen negative HSM.  Abdomen is soft nontender palpation.  Cervical lumbar spine without deformity.  Full and equal range of motion of the cervical lumbar spine.  No obvious deformities of the upper or lower extremities.  Patient has full equal range of motion upper and lower lower l extremities.  Cranial nerves II through XII grossly intact.     Assessment & Plan: Well exam   Physical exam was grossly unremarkable.  Discussed mild elevation of lipids.  Advised to follow-up as necessary.

## 2019-11-04 DIAGNOSIS — R69 Illness, unspecified: Secondary | ICD-10-CM | POA: Diagnosis not present

## 2019-11-30 DIAGNOSIS — R69 Illness, unspecified: Secondary | ICD-10-CM | POA: Diagnosis not present

## 2019-12-29 DIAGNOSIS — R69 Illness, unspecified: Secondary | ICD-10-CM | POA: Diagnosis not present

## 2020-01-18 DIAGNOSIS — R69 Illness, unspecified: Secondary | ICD-10-CM | POA: Diagnosis not present

## 2020-02-03 DIAGNOSIS — R69 Illness, unspecified: Secondary | ICD-10-CM | POA: Diagnosis not present

## 2020-02-09 DIAGNOSIS — Z20822 Contact with and (suspected) exposure to covid-19: Secondary | ICD-10-CM | POA: Diagnosis not present

## 2020-03-26 ENCOUNTER — Other Ambulatory Visit: Payer: Self-pay | Admitting: Physician Assistant

## 2020-03-26 DIAGNOSIS — I10 Essential (primary) hypertension: Secondary | ICD-10-CM

## 2020-03-29 ENCOUNTER — Other Ambulatory Visit: Payer: Self-pay

## 2020-05-22 ENCOUNTER — Other Ambulatory Visit: Payer: 59

## 2020-05-22 ENCOUNTER — Other Ambulatory Visit: Payer: Self-pay

## 2020-05-22 NOTE — Progress Notes (Signed)
Lab [employer] ID: [id number]

## 2020-06-02 DIAGNOSIS — Z20822 Contact with and (suspected) exposure to covid-19: Secondary | ICD-10-CM | POA: Diagnosis not present

## 2020-06-29 DIAGNOSIS — H5203 Hypermetropia, bilateral: Secondary | ICD-10-CM | POA: Diagnosis not present

## 2020-08-16 DIAGNOSIS — Z20822 Contact with and (suspected) exposure to covid-19: Secondary | ICD-10-CM | POA: Diagnosis not present

## 2020-09-30 ENCOUNTER — Other Ambulatory Visit: Payer: Self-pay | Admitting: Physician Assistant

## 2020-09-30 DIAGNOSIS — I10 Essential (primary) hypertension: Secondary | ICD-10-CM

## 2020-10-06 ENCOUNTER — Telehealth: Payer: Self-pay

## 2020-10-06 NOTE — Telephone Encounter (Signed)
Patient called yesterday about Rx refill for Bisopolol-HCTZ & requested to have these other items removed from his medication list.  AMD

## 2020-11-17 ENCOUNTER — Ambulatory Visit: Payer: Self-pay

## 2020-11-17 ENCOUNTER — Other Ambulatory Visit: Payer: Self-pay

## 2020-11-17 DIAGNOSIS — Z0000 Encounter for general adult medical examination without abnormal findings: Secondary | ICD-10-CM

## 2020-11-17 LAB — POCT URINALYSIS DIPSTICK
Bilirubin, UA: NEGATIVE
Blood, UA: NEGATIVE
Glucose, UA: NEGATIVE
Ketones, UA: NEGATIVE
Leukocytes, UA: NEGATIVE
Nitrite, UA: NEGATIVE
Protein, UA: NEGATIVE
Spec Grav, UA: >=1.03 — AB (ref 1.010–1.025)
Urobilinogen, UA: 0.2 E.U./dL
pH, UA: 5.5 (ref 5.0–8.0)

## 2020-11-18 LAB — CMP12+LP+TP+TSH+6AC+PSA+CBC…
ALT: 23 IU/L (ref 0–44)
AST: 38 IU/L (ref 0–40)
Albumin/Globulin Ratio: 2.3 — ABNORMAL HIGH (ref 1.2–2.2)
Albumin: 5 g/dL — ABNORMAL HIGH (ref 3.8–4.9)
Alkaline Phosphatase: 65 IU/L (ref 44–121)
BUN/Creatinine Ratio: 13 (ref 9–20)
BUN: 16 mg/dL (ref 6–24)
Basophils Absolute: 0.1 10*3/uL (ref 0.0–0.2)
Basos: 1 %
Bilirubin Total: 1 mg/dL (ref 0.0–1.2)
Calcium: 9.7 mg/dL (ref 8.7–10.2)
Chloride: 103 mmol/L (ref 96–106)
Chol/HDL Ratio: 3 ratio (ref 0.0–5.0)
Cholesterol, Total: 207 mg/dL — ABNORMAL HIGH (ref 100–199)
Creatinine, Ser: 1.22 mg/dL (ref 0.76–1.27)
EOS (ABSOLUTE): 0.2 10*3/uL (ref 0.0–0.4)
Eos: 4 %
Estimated CHD Risk: <0.5 times avg. (ref 0.0–1.0)
Free Thyroxine Index: 1.5 (ref 1.2–4.9)
GFR calc Af Amer: 79 mL/min/{1.73_m2} (ref >59)
GFR calc non Af Amer: 68 mL/min/{1.73_m2} (ref >59)
GGT: 14 IU/L (ref 0–65)
Globulin, Total: 2.2 g/dL (ref 1.5–4.5)
Glucose: 87 mg/dL (ref 65–99)
HDL: 70 mg/dL (ref >39)
Hematocrit: 43.5 % (ref 37.5–51.0)
Hemoglobin: 14.3 g/dL (ref 13.0–17.7)
Immature Grans (Abs): 0 10*3/uL (ref 0.0–0.1)
Immature Granulocytes: 1 %
Iron: 90 ug/dL (ref 38–169)
LDH: 215 IU/L (ref 121–224)
LDL Chol Calc (NIH): 129 mg/dL — ABNORMAL HIGH (ref 0–99)
Lymphocytes Absolute: 1.6 10*3/uL (ref 0.7–3.1)
Lymphs: 26 %
MCH: 27.4 pg (ref 26.6–33.0)
MCHC: 32.9 g/dL (ref 31.5–35.7)
MCV: 84 fL (ref 79–97)
Monocytes Absolute: 0.5 10*3/uL (ref 0.1–0.9)
Monocytes: 7 %
Neutrophils Absolute: 3.9 10*3/uL (ref 1.4–7.0)
Neutrophils: 61 %
Phosphorus: 3.9 mg/dL (ref 2.8–4.1)
Platelets: 264 10*3/uL (ref 150–450)
Potassium: 4.3 mmol/L (ref 3.5–5.2)
Prostate Specific Ag, Serum: 1.2 ng/mL (ref 0.0–4.0)
RBC: 5.21 x10E6/uL (ref 4.14–5.80)
RDW: 11.5 % — ABNORMAL LOW (ref 11.6–15.4)
Sodium: 139 mmol/L (ref 134–144)
T3 Uptake Ratio: 24 % (ref 24–39)
T4, Total: 6.3 ug/dL (ref 4.5–12.0)
TSH: 3.14 u[IU]/mL (ref 0.450–4.500)
Total Protein: 7.2 g/dL (ref 6.0–8.5)
Triglycerides: 42 mg/dL (ref 0–149)
Uric Acid: 5.5 mg/dL (ref 3.8–8.4)
VLDL Cholesterol Cal: 8 mg/dL (ref 5–40)
WBC: 6.3 10*3/uL (ref 3.4–10.8)

## 2020-11-22 ENCOUNTER — Encounter: Payer: 59 | Admitting: Adult Medicine

## 2021-02-09 ENCOUNTER — Encounter: Payer: Self-pay | Admitting: Adult Health

## 2021-02-09 ENCOUNTER — Ambulatory Visit: Payer: Self-pay | Admitting: Adult Health

## 2021-02-09 ENCOUNTER — Other Ambulatory Visit: Payer: Self-pay

## 2021-02-09 VITALS — BP 120/84 | HR 61 | Temp 97.6°F | Resp 14 | Ht 70.0 in | Wt 231.0 lb

## 2021-02-09 DIAGNOSIS — Z0000 Encounter for general adult medical examination without abnormal findings: Secondary | ICD-10-CM

## 2021-02-09 DIAGNOSIS — M2240 Chondromalacia patellae, unspecified knee: Secondary | ICD-10-CM | POA: Insufficient documentation

## 2021-02-09 DIAGNOSIS — N522 Drug-induced erectile dysfunction: Secondary | ICD-10-CM

## 2021-02-09 DIAGNOSIS — S83249A Other tear of medial meniscus, current injury, unspecified knee, initial encounter: Secondary | ICD-10-CM | POA: Insufficient documentation

## 2021-02-09 DIAGNOSIS — M25569 Pain in unspecified knee: Secondary | ICD-10-CM | POA: Insufficient documentation

## 2021-02-09 MED ORDER — SILDENAFIL CITRATE 20 MG PO TABS: 20.0000 mg | ORAL_TABLET | Freq: Every day | ORAL | 0 refills | Status: DC | PRN

## 2021-02-09 NOTE — Progress Notes (Signed)
[facility] [place], [place] [address]  Internal MEDICINE  Office Visit Note  Patient Name: Bryan Stephenson  [id number]  [id number]  Date of Service: 02/09/2021  Chief Complaint  Patient presents with  . Annual Exam     HPI Pt is here for routine health maintenance examination.  Patient is a 50 yo AA male.  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  He is married and has two daughters Age 59, and 60. Surgical and medical history are reviewed. He is active and Lifts weights regularly.  He avoids red meat, and attempts to eat a mostly clean diet.      Current Medication: Outpatient Encounter Medications as of 02/09/2021  Medication Sig  . bisoprolol-hydrochlorothiazide (ZIAC) 5-6.25 MG tablet TAKE 2 TABLETS BY MOUTH EVERY DAY IN THE MORNING  . Multiple Vitamin (ONE-A-DAY MENS PO) Take 1 tablet by mouth daily.  . sildenafil (REVATIO) 20 MG tablet Take 1 tablet (20 mg total) by mouth daily as needed (as needed for sexual activity).  . [DISCONTINUED] chlorhexidine (PERIDEX) 0.12 % solution SMARTSIG:By Mouth   No facility-administered encounter medications on file as of 02/09/2021.    Surgical History: Past Surgical History:  Procedure Laterality Date  . FRACTURE SURGERY Right 1984   FEMUR FX  . KNEE ARTHROSCOPY Left 2014   pins and screws  . KNEE ARTHROSCOPY WITH MEDIAL MENISECTOMY Right 10/23/2017   Procedure: KNEE ARTHROSCOPY WITH MEDIAL MENISECTOMY;  Surgeon: Thornton Park, MD;  Location: [facility] ORS;  Service: Orthopedics;  Laterality: Right;    Medical History: Past Medical History:  Diagnosis Date  . Asthma   . Complication of anesthesia   . Hypertension   . PONV (postoperative nausea and vomiting)    "violent" vomitting    Family History: Family History  Problem Relation Age of Onset  . Breast cancer Mother   . Hyperlipidemia Father   . Glaucoma Father   . Lung cancer Maternal Grandfather   .  Diabetes Paternal Grandmother     Social History: Social History   Socioeconomic History  . Marital status: Married    Spouse name: Not on file  . Number of children: Not on file  . Years of education: Not on file  . Highest education level: Not on file  Occupational History  . Not on file  Tobacco Use  . Smoking status: Never Smoker  . Smokeless tobacco: Never Used  Vaping Use  . Vaping Use: Never used  Substance and Sexual Activity  . Alcohol use: No  . Drug use: No  . Sexual activity: Yes  Other Topics Concern  . Not on file  Social History Narrative  . Not on file   Social Determinants of Health   Financial Resource Strain: Not on file  Food Insecurity: Not on file  Transportation Needs: Not on file  Physical Activity: Not on file  Stress: Not on file  Social Connections: Not on file      Review of Systems  Constitutional: Negative for activity change, appetite change and fatigue.  HENT: Negative for congestion, sinus pain, trouble swallowing and voice change.   Eyes: Negative for pain, discharge and visual disturbance.  Respiratory: Negative for cough, chest tightness and shortness of breath.   Cardiovascular: Negative for chest pain and leg swelling.  Gastrointestinal: Negative for abdominal distention, abdominal pain, constipation and diarrhea.  Genitourinary: Negative for difficulty urinating, flank pain, penile discharge, penile pain, penile swelling,  scrotal swelling and testicular pain.       +Erectile dysfunction  Musculoskeletal: Negative for arthralgias, back pain and neck pain.  Skin: Negative for color change.  Neurological: Negative for dizziness, weakness and headaches.  Hematological: Negative for adenopathy.  Psychiatric/Behavioral: Negative for agitation, confusion and suicidal ideas.     Vital Signs: BP 120/84   Pulse 61   Temp 97.6 F (36.4 C)   Resp 14   Ht '5\' 10"'  (1.778 m)   Wt 231 lb (104.8 kg)   SpO2 100%   BMI 33.15 kg/m     Physical Exam Constitutional:      Appearance: Normal appearance.  HENT:     Head: Normocephalic.     Right Ear: Tympanic membrane normal.     Left Ear: Tympanic membrane normal.     Nose: Nose normal.     Mouth/Throat:     Mouth: Mucous membranes are moist.     Pharynx: No oropharyngeal exudate or posterior oropharyngeal erythema.  Eyes:     General:        Right eye: No discharge.        Left eye: No discharge.     Extraocular Movements: Extraocular movements intact.     Pupils: Pupils are equal, round, and reactive to light.  Cardiovascular:     Rate and Rhythm: Normal rate and regular rhythm.     Pulses: Normal pulses.     Heart sounds: Normal heart sounds. No murmur heard.   Pulmonary:     Effort: Pulmonary effort is normal. No respiratory distress.     Breath sounds: Normal breath sounds. No wheezing or rhonchi.  Abdominal:     General: Abdomen is flat. Bowel sounds are normal. There is no distension.     Palpations: There is no mass.     Tenderness: There is no abdominal tenderness. There is no guarding.     Hernia: No hernia is present.  Musculoskeletal:        General: No swelling or deformity. Normal range of motion.     Cervical back: Normal range of motion.  Skin:    General: Skin is warm and dry.     Capillary Refill: Capillary refill takes less than 2 seconds.  Neurological:     General: No focal deficit present.     Mental Status: He is alert.     Cranial Nerves: No cranial nerve deficit.     Gait: Gait normal.  Psychiatric:        Mood and Affect: Mood normal.        Behavior: Behavior normal.        Judgment: Judgment normal.      LABS: Recent Results (from the past 2160 hour(s))  CMP12+LP+TP+TSH+6AC+PSA+CBC.     Status: Abnormal   Collection Time: 11/17/20 10:26 AM  Result Value Ref Range   Glucose 87 65 - 99 mg/dL   Uric Acid 5.5 3.8 - 8.4 mg/dL    Comment:            Therapeutic target for gout patients: <6.0   BUN 16 6 - 24 mg/dL    Creatinine, Ser 1.22 0.76 - 1.27 mg/dL   GFR calc non Af Amer 68 >59 mL/min/1.73   GFR calc Af Amer 79 >59 mL/min/1.73    Comment: **In accordance with recommendations from the NKF-ASN Task force,**   [facility] is in the process of updating its eGFR calculation to the   2021 CKD-EPI creatinine equation that estimates kidney  function   without a race variable.    BUN/Creatinine Ratio 13 9 - 20   Sodium 139 134 - 144 mmol/L   Potassium 4.3 3.5 - 5.2 mmol/L   Chloride 103 96 - 106 mmol/L   Calcium 9.7 8.7 - 10.2 mg/dL   Phosphorus 3.9 2.8 - 4.1 mg/dL   Total Protein 7.2 6.0 - 8.5 g/dL   Albumin 5.0 (H) 3.8 - 4.9 g/dL   Globulin, Total 2.2 1.5 - 4.5 g/dL   Albumin/Globulin Ratio 2.3 (H) 1.2 - 2.2   Bilirubin Total 1.0 0.0 - 1.2 mg/dL   Alkaline Phosphatase 65 44 - 121 IU/L   LDH 215 121 - 224 IU/L   AST 38 0 - 40 IU/L   ALT 23 0 - 44 IU/L   GGT 14 0 - 65 IU/L   Iron 90 38 - 169 ug/dL   Cholesterol, Total 207 (H) 100 - 199 mg/dL   Triglycerides 42 0 - 149 mg/dL   HDL 70 >39 mg/dL   VLDL Cholesterol Cal 8 5 - 40 mg/dL   LDL Chol Calc (NIH) 129 (H) 0 - 99 mg/dL   Chol/HDL Ratio 3.0 0.0 - 5.0 ratio    Comment:                                   T. Chol/HDL Ratio                                             Men  Women                               1/2 Avg.Risk  3.4    3.3                                   Avg.Risk  5.0    4.4                                2X Avg.Risk  9.6    7.1                                3X Avg.Risk 23.4   11.0    Estimated CHD Risk  < 0.5 0.0 - 1.0 times avg.    Comment: The CHD Risk is based on the T. Chol/HDL ratio. Other factors affect CHD Risk such as hypertension, smoking, diabetes, severe obesity, and family history of premature CHD.    TSH 3.140 0.450 - 4.500 uIU/mL   T4, Total 6.3 4.5 - 12.0 ug/dL   T3 Uptake Ratio 24 24 - 39 %   Free Thyroxine Index 1.5 1.2 - 4.9   Prostate Specific Ag, Serum 1.2 0.0 - 4.0 ng/mL    Comment: Roche ECLIA  methodology. According to the American Urological Association, Serum PSA should decrease and remain at undetectable levels after radical prostatectomy. The AUA defines biochemical recurrence as an initial PSA value 0.2 ng/mL or greater followed by a subsequent confirmatory PSA value 0.2 ng/mL or greater. Values obtained with different assay methods or  kits cannot be used interchangeably. Results cannot be interpreted as absolute evidence of the presence or absence of malignant disease.    WBC 6.3 3.4 - 10.8 x10E3/uL   RBC 5.21 4.14 - 5.80 x10E6/uL   Hemoglobin 14.3 13.0 - 17.7 g/dL   Hematocrit 43.5 37.5 - 51.0 %   MCV 84 79 - 97 fL   MCH 27.4 26.6 - 33.0 pg   MCHC 32.9 31.5 - 35.7 g/dL   RDW 11.5 (L) 11.6 - 15.4 %   Platelets 264 150 - 450 x10E3/uL   Neutrophils 61 Not Estab. %   Lymphs 26 Not Estab. %   Monocytes 7 Not Estab. %   Eos 4 Not Estab. %   Basos 1 Not Estab. %   Neutrophils Absolute 3.9 1.4 - 7.0 x10E3/uL   Lymphocytes Absolute 1.6 0.7 - 3.1 x10E3/uL   Monocytes Absolute 0.5 0.1 - 0.9 x10E3/uL   EOS (ABSOLUTE) 0.2 0.0 - 0.4 x10E3/uL   Basophils Absolute 0.1 0.0 - 0.2 x10E3/uL   Immature Granulocytes 1 Not Estab. %   Immature Grans (Abs) 0.0 0.0 - 0.1 x10E3/uL  POCT urinalysis dipstick     Status: Abnormal   Collection Time: 11/17/20 10:31 AM  Result Value Ref Range   Color, UA yellow    Clarity, UA clear    Glucose, UA Negative Negative   Bilirubin, UA negative    Ketones, UA negative    Spec Grav, UA >=1.030 (A) 1.010 - 1.025   Blood, UA negative    pH, UA 5.5 5.0 - 8.0   Protein, UA Negative Negative   Urobilinogen, UA 0.2 0.2 or 1.0 E.U./dL   Nitrite, UA negative    Leukocytes, UA Negative Negative   Appearance medium    Odor       Assessment/Plan: 1. Routine adult health maintenance EKG reviewed First degree AV block, present on previous EKG as well.    - EKG 12-Lead Gi referral for Screening Colonoscopy. - Ambulatory referral to  Gastroenterology  3. Drug-induced erectile dysfunction Discussed ED symptoms with patient.  Will check Testosterone and Prolactin. Take Sildenafil as discussed 30-45 minutes before sexual activity.    - Testosterone,Free and Total - Prolactin  - sildenafil (REVATIO) 20 MG tablet; Take 1 tablet (20 mg total) by mouth daily as needed (as needed for sexual activity).  Dispense: 30 tablet; Refill: 0  Follow up in 6-8 weeks for med follow-up.  General Counseling: Pasqualino verbalizes understanding of the findings of todays visit and agrees with plan of treatment. I have discussed any further diagnostic evaluation that may be needed or ordered today. We also reviewed his medications today. he has been encouraged to call the office with any questions or concerns that should arise related to todays visit.    Orders Placed This Encounter  Procedures  . Testosterone,Free and Total  . Prolactin  . Ambulatory referral to Gastroenterology  . EKG 12-Lead    Meds ordered this encounter  Medications  . sildenafil (REVATIO) 20 MG tablet    Sig: Take 1 tablet (20 mg total) by mouth daily as needed (as needed for sexual activity).    Dispense:  30 tablet    Refill:  0    Total time spent: 30 Minutes  Time spent includes review of chart, medications, test results, and follow up plan with the patient.    Kendell Bane AGNP-C Nurse Practitioner

## 2021-02-14 LAB — TESTOSTERONE,FREE AND TOTAL
Testosterone, Free: 11.7 pg/mL (ref 7.2–24.0)
Testosterone: 453 ng/dL (ref 264–916)

## 2021-02-14 LAB — PROLACTIN: Prolactin: 13.2 ng/mL (ref 4.0–15.2)

## 2021-02-15 NOTE — Addendum Note (Signed)
Addended by: Gardner Candle on: 02/15/2021 01:27 PM   Modules accepted: Orders

## 2021-02-20 ENCOUNTER — Encounter: Payer: Self-pay | Admitting: Urology

## 2021-02-20 ENCOUNTER — Ambulatory Visit (INDEPENDENT_AMBULATORY_CARE_PROVIDER_SITE_OTHER): Payer: 59 | Admitting: Urology

## 2021-02-20 ENCOUNTER — Other Ambulatory Visit: Payer: Self-pay

## 2021-02-20 VITALS — BP 130/80 | HR 54 | Ht 70.0 in | Wt 238.0 lb

## 2021-02-20 DIAGNOSIS — Z125 Encounter for screening for malignant neoplasm of prostate: Secondary | ICD-10-CM

## 2021-02-20 DIAGNOSIS — N529 Male erectile dysfunction, unspecified: Secondary | ICD-10-CM | POA: Diagnosis not present

## 2021-02-20 MED ORDER — TADALAFIL 5 MG PO TABS: 5.0000 mg | ORAL_TABLET | ORAL | 11 refills | Status: DC

## 2021-02-20 NOTE — Progress Notes (Signed)
   02/20/21 1:53 PM   Blu Pasty Arch 08/01/1971 [id number]  CC: ED, PSA screening  HPI: I saw Mr. Rehm in urology clinic today for the above issues.  He is a very healthy 50 year old male who has noticed some trouble with erections over the last 9 months.  His primary issue is maintaining an erection.  He he was recently trialed on low-dose sildenafil by his PCP with only minimal improvement in his symptoms.  He has been on bisoprolol-hydrochlorothiazide for over 10 years.    Recent testosterone was normal at 453.  He has a family history of breast cancer in his mother, but denies a family history of prostate cancer.  PSA was normal in December 2020 at 1.2.   PMH: Past Medical History:  Diagnosis Date  . Asthma   . Complication of anesthesia   . Hypertension   . PONV (postoperative nausea and vomiting)    "violent" vomitting    Family History: Family History  Problem Relation Age of Onset  . Breast cancer Mother   . Hyperlipidemia Father   . Glaucoma Father   . Lung cancer Maternal Grandfather   . Diabetes Paternal Grandmother     Social History:  reports that he has never smoked. He has never used smokeless tobacco. He reports that he does not drink alcohol and does not use drugs.  Physical Exam: BP 130/80   Pulse (!) 54   Ht 5\' 10"  (1.778 m)   Wt 238 lb (108 kg)   BMI 34.15 kg/m    Constitutional:  Alert and oriented, No acute distress. Cardiovascular: No clubbing, cyanosis, or edema. Respiratory: Normal respiratory effort, no increased work of breathing. GI: Abdomen is soft, nontender, nondistended, no abdominal masses  Laboratory Data: Reviewed, see HPI  Pertinent Imaging: None to review  Assessment & Plan:   50 year old male with mild ED and difficulty maintaining an erection.  We discussed alternative PDE 5 inhibitors like Cialis as well as the risks and benefits, and he is interested in a trial of the Cialis every other day.  We discussed that  this could also be taken as needed.  We discussed the impact of blood pressure medications on ED, though he has been on his current medication long-term with only new problems and erectile dysfunction.  In terms of PSA screening, we reviewed the AUA guidelines regarding the risks and benefits of screening, and to consider screening high risk patient starting at age 44.  With his African-American heritage and family history of breast cancer, he would qualify as higher risk, and I think continuing screening every other year is very reasonable.  Trial of Cialis 5 to 10 mg every other day for ED Continue PSA screening every other year with PCP, due next in December 2022 RTC 1 month symptom check  January 2023, MD 02/20/2021  [facility] [address], [address] [place], [place] [phone]

## 2021-02-20 NOTE — Patient Instructions (Signed)
Tadalafil tablets (Cialis) What is this medicine? TADALAFIL (tah DA la fil) is used to treat erection problems in men. It is also used for enlargement of the prostate gland in men, a condition called benign prostatic hyperplasia or BPH. This medicine improves urine flow and reduces BPH symptoms. This medicine can also treat both erection problems and BPH when they occur together. This medicine may be used for other purposes; ask your health care provider or pharmacist if you have questions. COMMON BRAND NAME(S): Adcirca, ALYQ, Cialis What should I tell my health care provider before I take this medicine? They need to know if you have any of these conditions:  bleeding disorders  eye or vision problems, including a rare inherited eye disease called retinitis pigmentosa  anatomical deformation of the penis, Peyronie's disease, or history of priapism (painful and prolonged erection)  heart disease, angina, a history of heart attack, irregular heart beats, or other heart problems  high or low blood pressure  history of blood diseases, like sickle cell anemia or leukemia  history of stomach bleeding  kidney disease  liver disease  stroke  an unusual or allergic reaction to tadalafil, other medicines, foods, dyes, or preservatives  pregnant or trying to get pregnant  breast-feeding How should I use this medicine? Take this medicine by mouth with a glass of water. Follow the directions on the prescription label. You may take this medicine with or without meals. When this medicine is used for erection problems, your doctor may prescribe it to be taken once daily or as needed. If you are taking the medicine as needed, you may be able to have sexual activity 30 minutes after taking it and for up to 36 hours after taking it. Whether you are taking the medicine as needed or once daily, you should not take more than one dose per day. If you are taking this medicine for symptoms of benign  prostatic hyperplasia (BPH) or to treat both BPH and an erection problem, take the dose once daily at about the same time each day. Do not take your medicine more often than directed. Talk to your pediatrician regarding the use of this medicine in children. Special care may be needed. Overdosage: If you think you have taken too much of this medicine contact a poison control center or emergency room at once. NOTE: This medicine is only for you. Do not share this medicine with others. What if I miss a dose? If you are taking this medicine as needed for erection problems, this does not apply. If you miss a dose while taking this medicine once daily for an erection problem, benign prostatic hyperplasia, or both, take it as soon as you remember, but do not take more than one dose per day. What may interact with this medicine? Do not take this medicine with any of the following medications:  nitrates like amyl nitrite, isosorbide dinitrate, isosorbide mononitrate, nitroglycerin  other medicines for erectile dysfunction like avanafil, sildenafil, vardenafil  other tadalafil products (Adcirca)  riociguat This medicine may also interact with the following medications:  certain drugs for high blood pressure  certain drugs for the treatment of HIV infection or AIDS  certain drugs used for fungal or yeast infections, like fluconazole, itraconazole, ketoconazole, and voriconazole  certain drugs used for seizures like carbamazepine, phenytoin, and phenobarbital  grapefruit juice  macrolide antibiotics like clarithromycin, erythromycin, troleandomycin  medicines for prostate problems  rifabutin, rifampin or rifapentine This list may not describe all possible interactions. Give your   health care provider a list of all the medicines, herbs, non-prescription drugs, or dietary supplements you use. Also tell them if you smoke, drink alcohol, or use illegal drugs. Some items may interact with your  medicine. What should I watch for while using this medicine? If you notice any changes in your vision while taking this drug, call your doctor or health care professional as soon as possible. Stop using this medicine and call your health care provider right away if you have a loss of sight in one or both eyes. Contact your doctor or health care professional right away if the erection lasts longer than 4 hours or if it becomes painful. This may be a sign of serious problem and must be treated right away to prevent permanent damage. If you experience symptoms of nausea, dizziness, chest pain or arm pain upon initiation of sexual activity after taking this medicine, you should refrain from further activity and call your doctor or health care professional as soon as possible. Do not drink alcohol to excess (examples, 5 glasses of wine or 5 shots of whiskey) when taking this medicine. When taken in excess, alcohol can increase your chances of getting a headache or getting dizzy, increasing your heart rate or lowering your blood pressure. Using this medicine does not protect you or your partner against HIV infection (the virus that causes AIDS) or other sexually transmitted diseases. What side effects may I notice from receiving this medicine? Side effects that you should report to your doctor or health care professional as soon as possible:  allergic reactions like skin rash, itching or hives, swelling of the face, lips, or tongue  breathing problems  changes in hearing  changes in vision  chest pain  fast, irregular heartbeat  prolonged or painful erection  seizures Side effects that usually do not require medical attention (report to your doctor or health care professional if they continue or are bothersome):  back pain  dizziness  flushing  headache  indigestion  muscle aches  nausea  stuffy or runny nose This list may not describe all possible side effects. Call your doctor  for medical advice about side effects. You may report side effects to FDA at 1-800-FDA-1088. Where should I keep my medicine? Keep out of the reach of children. Store at room temperature between 15 and 30 degrees C (59 and 86 degrees F). Throw away any unused medicine after the expiration date. NOTE: This sheet is a summary. It may not cover all possible information. If you have questions about this medicine, talk to your doctor, pharmacist, or health care provider.  2021 Elsevier/Gold Standard (2014-02-25 13:15:49)    Prostate Cancer Screening  Prostate cancer screening is a test that is done to check for the presence of prostate cancer in men. The prostate gland is a walnut-sized gland that is located below the bladder and in front of the rectum in males. The function of the prostate is to add fluid to semen during ejaculation. Prostate cancer is the second most common type of cancer in men. Who should have prostate cancer screening?  Screening recommendations vary based on age and other risk factors. Screening is recommended if:  You are older than age 43. If you are age 15-69, talk with your health care provider about your need for screening and how often screening should be done. Because most prostate cancers are slow growing and will not cause death, screening is generally reserved in this age group for men who have a  life expectancy.  You are younger than age 55, and you have these risk factors: ? Being a black male or a male of African descent. ? Having a father, brother, or uncle who has been diagnosed with prostate cancer. The risk is higher if your family member's cancer occurred at an early age. Screening is not recommended if:  You are younger than age 40.  You are between the ages of 40 and 54 and you have no risk factors.  You are 70 years of age or older. At this age, the risks that screening can cause are greater than the benefits that it may provide. If you are  at high risk for prostate cancer, your health care provider may recommend that you have screenings more often or that you start screening at a younger age. How is screening for prostate cancer done? The recommended prostate cancer screening test is a blood test called the prostate-specific antigen (PSA) test. PSA is a protein that is made in the prostate. As you age, your prostate naturally produces more PSA. Abnormally high PSA levels may be caused by:  Prostate cancer.  An enlarged prostate that is not caused by cancer (benign prostatic hyperplasia, BPH). This condition is very common in older men.  A prostate gland infection (prostatitis). Depending on the PSA results, you may need more tests, such as:  A physical exam to check the size of your prostate gland.  Blood and imaging tests.  A procedure to remove tissue samples from your prostate gland for testing (biopsy). What are the benefits of prostate cancer screening?  Screening can help to identify cancer at an early stage, before symptoms start and when the cancer can be treated more easily.  There is a small chance that screening may lower your risk of dying from prostate cancer. The chance is small because prostate cancer is a slow-growing cancer, and most men with prostate cancer die from a different cause. What are the risks of prostate cancer screening? The main risk of prostate cancer screening is diagnosing and treating prostate cancer that would never have caused any symptoms or problems. This is called overdiagnosisand overtreatment. PSA screening cannot tell you if your PSA is high due to cancer or a different cause. A prostate biopsy is the only procedure to diagnose prostate cancer. Even the results of a biopsy may not tell you if your cancer needs to be treated. Slow-growing prostate cancer may not need any treatment other than monitoring, so diagnosing and treating it may cause unnecessary stress or other side effects. A  prostate biopsy may also cause:  Infection or fever.  A false negative. This is a result that shows that you do not have prostate cancer when you actually do have prostate cancer. Questions to ask your health care provider  When should I start prostate cancer screening?  What is my risk for prostate cancer?  How often do I need screening?  What type of screening tests do I need?  How do I get my test results?  What do my results mean?  Do I need treatment? Where to find more information  The American Cancer Society: www.cancer.org  American Urological Association: www.auanet.org Contact a health care provider if:  You have difficulty urinating.  You have pain when you urinate or ejaculate.  You have blood in your urine or semen.  You have pain in your back or in the area of your prostate. Summary  Prostate cancer is a common type of cancer   cancer in men. The prostate gland is located below the bladder and in front of the rectum. This gland adds fluid to semen during ejaculation.  Prostate cancer screening may identify cancer at an early stage, when the cancer can be treated more easily.  The prostate-specific antigen (PSA) test is the recommended screening test for prostate cancer.  Discuss the risks and benefits of prostate cancer screening with your health care provider. If you are age 80 or older, the risks that screening can cause are greater than the benefits that it may provide. This information is not intended to replace advice given to you by your health care provider. Make sure you discuss any questions you have with your health care provider. Document Revised: 01/28/2020 Document Reviewed: 05/20/2019 Elsevier Patient Education  2021 [employer].

## 2021-03-09 ENCOUNTER — Other Ambulatory Visit: Payer: Self-pay | Admitting: Adult Health

## 2021-03-09 DIAGNOSIS — N522 Drug-induced erectile dysfunction: Secondary | ICD-10-CM

## 2021-03-20 ENCOUNTER — Other Ambulatory Visit: Payer: Self-pay | Admitting: Physician Assistant

## 2021-03-20 DIAGNOSIS — I10 Essential (primary) hypertension: Secondary | ICD-10-CM

## 2021-03-23 ENCOUNTER — Ambulatory Visit: Payer: Self-pay | Admitting: Physician Assistant

## 2021-03-27 ENCOUNTER — Other Ambulatory Visit: Payer: Self-pay

## 2021-03-27 ENCOUNTER — Ambulatory Visit (INDEPENDENT_AMBULATORY_CARE_PROVIDER_SITE_OTHER): Payer: 59 | Admitting: Urology

## 2021-03-27 ENCOUNTER — Encounter: Payer: Self-pay | Admitting: Urology

## 2021-03-27 VITALS — BP 151/84 | HR 57 | Ht 70.0 in | Wt 228.0 lb

## 2021-03-27 DIAGNOSIS — Z125 Encounter for screening for malignant neoplasm of prostate: Secondary | ICD-10-CM

## 2021-03-27 DIAGNOSIS — F524 Premature ejaculation: Secondary | ICD-10-CM

## 2021-03-27 DIAGNOSIS — N529 Male erectile dysfunction, unspecified: Secondary | ICD-10-CM | POA: Diagnosis not present

## 2021-03-27 DIAGNOSIS — R69 Illness, unspecified: Secondary | ICD-10-CM | POA: Diagnosis not present

## 2021-03-27 MED ORDER — PAROXETINE HCL 20 MG PO TABS
20.0000 mg | ORAL_TABLET | Freq: Every day | ORAL | 6 refills | Status: DC | PRN
Start: 2021-03-27 — End: 2021-03-27

## 2021-03-27 MED ORDER — PAROXETINE HCL 20 MG PO TABS: 20.0000 mg | ORAL_TABLET | Freq: Every day | ORAL | 6 refills | Status: DC | PRN

## 2021-03-27 NOTE — Progress Notes (Signed)
   03/27/2021 10:41 AM   Bryan Stephenson [birth year] [id number]  Reason for visit: Follow up ED, PSA screening, premature ejaculations  HPI: I saw Bryan Stephenson back in clinic for the above issues.  At our last visit on 02/20/2021 my understanding was his primary issue was difficulty maintaining the erection, and he was started on Cialis 5 to 10 mg every other day.  Recent testosterone was normal at 453.  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  He is able to get and maintain an erection, his issue is that he reaches orgasm and ejaculation in 4 to 6 minutes which his wife is bothered by.  He really has no complaints regarding the erections or orgasm or ejaculation, but is interested in trying to last longer.  We reviewed the AUA guidelines regarding erectile dysfunction and premature ejaculation and the overlap in these issues.  I do think his primary issue is the premature ejaculation, but he does not meet true criteria based on his latency time of 4 to 6 minutes.  We discussed behavioral strategies at length including the start stop technique and the squeeze technique.  I think it is reasonable for him to discontinue the Cialis at this time.  I recommended starting with the behavioral strategies first, and I also sent paroxetine 20 mg on demand to be taken 3 to 4 hours prior to sexual activity if he does not notice improvement with the behavioral strategies.  Risks and benefits discussed.  -Behavioral strategies discussed -If no improvement on the behavioral strategies, prescription sent for paroxetine 20 mg on demand for premature ejaculation -RTC 3 months virtual visit to discuss premature ejaculation -Will need repeat PSA in fall 2022  Sondra Come, MD  [facility] [address] [place], [place] [address] [phone]

## 2021-03-27 NOTE — Patient Instructions (Signed)
Diagnosis In addition to asking about your sex life, your doctor will ask about your health history and might do a physical exam. If you have both premature ejaculation and trouble getting or maintaining an erection, your doctor might order blood tests to check your male hormone (testosterone) levels or other tests.  In some cases, your doctor might suggest that you go to a urologist or a mental health professional who specializes in sexual dysfunction.  Treatment Common treatment options for premature ejaculation include behavioral techniques, topical anesthetics, medications and counseling. Keep in mind that it might take time to find the treatment or combination of treatments that will work for you. Behavioral treatment plus drug therapy might be the most effective course.  Behavioral techniques In some cases, therapy for premature ejaculation might involve taking simple steps, such as masturbating an hour or two before intercourse so that you're able to delay ejaculation during sex. Your doctor also might recommend avoiding intercourse for a period of time and focusing on other types of sexual play so that pressure is removed from your sexual encounters.  Pelvic floor exercises Location of male pelvic floor muscles Male pelvic floor musclesOpen pop-up dialog box Weak pelvic floor muscles might impair your ability to delay ejaculation. Pelvic floor exercises (Kegel exercises) can help strengthen these muscles.  To perform these exercises:  Find the right muscles. To identify your pelvic floor muscles, stop urination in midstream or tighten the muscles that keep you from passing gas. These maneuvers use your pelvic floor muscles. Once you've identified your pelvic floor muscles, you can do the exercises in any position, although you might find it easier to do them lying down at first. Perfect your technique. Tighten your pelvic floor muscles, hold the contraction for three seconds, and then  relax for three seconds. Try it a few times in a row. When your muscles get stronger, try doing Kegel exercises while sitting, standing or walking. Maintain your focus. For best results, focus on tightening only your pelvic floor muscles. Be careful not to flex the muscles in your abdomen, thighs or buttocks. Avoid holding your breath. Instead, breathe freely during the exercises. Repeat 3 times a day. Aim for at least three sets of 10 repetitions a day. The pause-squeeze technique Your doctor might instruct you and your partner in the use of a method called the pause-squeeze technique. This method works as follows:  Begin sexual activity as usual, including stimulation of the penis, until you feel almost ready to ejaculate. Have your partner squeeze the end of your penis, at the point where the head (glans) joins the shaft, and maintain the squeeze for several seconds, until the urge to ejaculate passes. Have your partner repeat the squeeze process as necessary. By repeating as many times as necessary, you can reach the point of entering your partner without ejaculating. After some practice sessions, the feeling of knowing how to delay ejaculation might become a habit that no longer requires the pause-squeeze technique.  If the pause-squeeze technique causes pain or discomfort, another technique is to stop sexual stimulation just prior to ejaculation, wait until the level of arousal has diminished and then start again. This approach is known as the stop-start technique.  Condoms Condoms might decrease penis sensitivity, which can help delay ejaculation. "Climax control" condoms are available over the counter. These condoms contain numbing agents such as benzocaine or lidocaine or are made of thicker latex to delay ejaculation. Examples include Trojan Extended, Durex Performax Intense and Lifestyles Everlast  Intense.  Medications Topical anesthetics Anesthetic creams and sprays that contain a  numbing agent, such as benzocaine, lidocaine or prilocaine, are sometimes used to treat premature ejaculation. These products are applied to the penis 10 to 15 minutes before sex to reduce sensation and help delay ejaculation.  A lidocaine-prilocaine cream for premature ejaculation (EMLA) is available by prescription. Lidocaine sprays for premature ejaculation are available over-the-counter.  Although topical anesthetic agents are effective and well-tolerated, they have potential side effects. For example, some men report temporary loss of sensitivity and decreased sexual pleasure. Sometimes, male partners also have reported these effects.  Oral medications Many medications might delay orgasm. Although none of these drugs are specifically approved by the Food and Drug Administration to treat premature ejaculation, some are used for this purpose, including antidepressants, analgesics and phosphodiesterase-5 inhibitors. These medications might be prescribed for either on-demand or daily use, and might be prescribed alone or in combination with other treatments.  Antidepressants. A side effect of certain antidepressants is delayed orgasm. For this reason, selective serotonin reuptake inhibitors (SSRIs), such as escitalopram (Lexapro), sertraline (Zoloft), paroxetine (Paxil) or fluoxetine (Prozac, Sarafem), are used to help delay ejaculation.  Of those approved for use in the [place], paroxetine seems to be the most effective. These medications usually take five to 10 days to begin working. But it might take two to three weeks of treatment before you'll see the full effect.  If SSRIs don't improve the timing of your ejaculation, your doctor might prescribe the tricyclic antidepressant clomipramine (Anafranil). Unwanted side effects of antidepressants might include nausea, perspiration, drowsiness and decreased libido.  Analgesics. Tramadol (Ultram) is a medication commonly used to treat pain. It  also has side effects that delay ejaculation. Unwanted side effects might include nausea, headache, sleepiness and dizziness.  It might be prescribed when SSRIs haven't been effective. Tramadol can't be used in combination with an SSRI.  Phosphodiesterase-5 inhibitors. Some medications used to treat erectile dysfunction, such as sildenafil (Viagra), tadalafil (Cialis, Adcirca) or vardenafil (Levitra, Staxyn), also might help premature ejaculation. Unwanted side effects might include headache, facial flushing and indigestion. These medications might be more effective when used in combination with an SSRI. Potential future treatment Research suggests that several drugs that might be helpful in treating premature ejaculation, but further study is needed. These drugs include:  Dapoxetine. This is an SSRI that's often used as the first treatment for premature ejaculation in other countries. It's not currently available in the [place]. Modafinil (Provigil). This is a treatment for the sleeping disorder narcolepsy. Silodosin (Rapaflo).This drug is normally a treatment for prostate gland enlargement (benign prostatic hyperplasia). Counseling This approach involves talking with a mental health provider about your relationships and experiences. Sessions can help you reduce performance anxiety and find better ways of coping with stress. Counseling is most likely to help when it's used in combination with drug therapy.  With premature ejaculation, you might feel you lose some of the closeness shared with a sexual partner. You might feel angry, ashamed and upset, and turn away from your partner.  Your partner also might be upset with the change in sexual intimacy. Premature ejaculation can cause partners to feel less connected or hurt. Talking about the problem is an important step, and relationship counseling or sex therapy might be helpful.

## 2021-05-01 DIAGNOSIS — Z20822 Contact with and (suspected) exposure to covid-19: Secondary | ICD-10-CM | POA: Diagnosis not present

## 2021-05-02 ENCOUNTER — Emergency Department
Admission: EM | Admit: 2021-05-02 | Discharge: 2021-05-02 | Disposition: A | Discharge status: Home / Self Care | Payer: No Typology Code available for payment source | Attending: Emergency Medicine | Admitting: Emergency Medicine

## 2021-05-02 ENCOUNTER — Encounter: Payer: Self-pay | Admitting: Emergency Medicine

## 2021-05-02 ENCOUNTER — Other Ambulatory Visit: Payer: Self-pay

## 2021-05-02 DIAGNOSIS — I10 Essential (primary) hypertension: Secondary | ICD-10-CM | POA: Diagnosis not present

## 2021-05-02 DIAGNOSIS — H5789 Other specified disorders of eye and adnexa: Secondary | ICD-10-CM | POA: Insufficient documentation

## 2021-05-02 DIAGNOSIS — J45909 Unspecified asthma, uncomplicated: Secondary | ICD-10-CM | POA: Insufficient documentation

## 2021-05-02 DIAGNOSIS — Z79899 Other long term (current) drug therapy: Secondary | ICD-10-CM | POA: Diagnosis not present
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
## 2021-05-02 NOTE — Discharge Instructions (Signed)
Please return at once for increasing pain or any visual problems.  Follow-up with Dr. Inez Pilgrim the eye doctor on Monday or Tuesday in the office.  Give him a call later today to schedule it.
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
## 2021-06-26 ENCOUNTER — Telehealth (INDEPENDENT_AMBULATORY_CARE_PROVIDER_SITE_OTHER): Payer: 59 | Admitting: Urology

## 2021-06-26 ENCOUNTER — Other Ambulatory Visit: Payer: Self-pay

## 2021-06-26 DIAGNOSIS — F524 Premature ejaculation: Secondary | ICD-10-CM

## 2021-06-26 DIAGNOSIS — R69 Illness, unspecified: Secondary | ICD-10-CM | POA: Diagnosis not present

## 2021-06-26 DIAGNOSIS — Z125 Encounter for screening for malignant neoplasm of prostate: Secondary | ICD-10-CM | POA: Diagnosis not present

## 2021-06-26 NOTE — Progress Notes (Signed)
Virtual Visit via Telephone Note  I connected with Bryan Stephenson on 06/26/21 at  8:30 AM EDT by telephone and verified that I am speaking with the correct person using two identifiers.   Patient location: Home Provider location: [facility] Urologic Office   I discussed the limitations, risks, security and privacy concerns of performing an evaluation and management service by telephone and the availability of in person appointments. We discussed the impact of the COVID-19 pandemic on the healthcare system, and the importance of social distancing and reducing patient and provider exposure. I also discussed with the patient that there may be a patient responsible charge related to this service. The patient expressed understanding and agreed to proceed.  Reason for visit: Premature ejaculation  History of Present Illness: 50 year old male with normal testosterone and premature ejaculation that is primarily bothersome to his wife, and his latency time is typically 4 to 6 minutes.  We originally tried Cialis to improve his erections, but the primary issue really was not ED.  Ultimately we opted for a trial of paroxetine 20 mg on demand, and he has noticed some improvement in this medication.  His wife has had a fair amount of stressors, and they have been less sexually active than usual recently, but he would like to continue on this dosing.  We discussed it could also be taken daily.  He is due for PSA in November 2022, and we will call with those results.   Follow Up: Continue paroxetine as needed for premature ejaculation Call with PSA results in November 2022 RTC 1 year regarding premature ejaculation and PSA screening   I discussed the assessment and treatment plan with the patient. The patient was provided an opportunity to ask questions and all were answered. The patient agreed with the plan and demonstrated an understanding of the instructions.   The patient was advised to call back  or seek an in-person evaluation if the symptoms worsen or if the condition fails to improve as anticipated.  I provided 8 minutes of non-face-to-face time during this encounter.   Sondra Come, MD

## 2021-12-21 ENCOUNTER — Other Ambulatory Visit: Payer: Self-pay

## 2021-12-21 NOTE — Telephone Encounter (Signed)
Bryan Stephenson called [facility] to request Rx refill for Ziac 5-6.25 mg. ? ?Upon entering Rx refill info into Epic, a box for a pended Rx refill request for Ziac 5-6.25 mg opened up where Bryan Stephenson's pharmacy already sent a Rx refill request electronically to [facility]  ED. ? ?Re-routed the pended  Rx refill for Ziac to ?Durward Parcel, PA-C. ? ?AMD ?

## 2021-12-24 ENCOUNTER — Other Ambulatory Visit: Payer: Self-pay | Admitting: Physician Assistant

## 2021-12-24 DIAGNOSIS — I10 Essential (primary) hypertension: Secondary | ICD-10-CM

## 2021-12-24 MED ORDER — BISOPROLOL-HYDROCHLOROTHIAZIDE 5-6.25 MG PO TABS: 2.0000 | ORAL_TABLET | Freq: Every morning | ORAL | 2 refills | Status: DC

## 2021-12-25 ENCOUNTER — Other Ambulatory Visit: Payer: Self-pay

## 2021-12-25 ENCOUNTER — Ambulatory Visit: Payer: Self-pay

## 2021-12-25 DIAGNOSIS — Z0000 Encounter for general adult medical examination without abnormal findings: Secondary | ICD-10-CM

## 2021-12-25 LAB — POCT URINALYSIS DIPSTICK
Bilirubin, UA: NEGATIVE
Blood, UA: NEGATIVE
Glucose, UA: NEGATIVE
Ketones, UA: NEGATIVE
Leukocytes, UA: NEGATIVE
Nitrite, UA: NEGATIVE
Protein, UA: NEGATIVE
Spec Grav, UA: >=1.03 — AB (ref 1.010–1.025)
Urobilinogen, UA: 0.2 E.U./dL
pH, UA: 5.5 (ref 5.0–8.0)

## 2021-12-25 NOTE — Progress Notes (Signed)
01/02/22 annual physical scheduled. ?

## 2021-12-26 LAB — CMP12+LP+TP+TSH+6AC+PSA+CBC…
ALT: 26 IU/L (ref 0–44)
AST: 29 IU/L (ref 0–40)
Albumin/Globulin Ratio: 2 (ref 1.2–2.2)
Alkaline Phosphatase: 67 IU/L (ref 44–121)
BUN/Creatinine Ratio: 15 (ref 9–20)
BUN: 19 mg/dL (ref 6–24)
Calcium: 9.8 mg/dL (ref 8.7–10.2)
Chol/HDL Ratio: 3.3 ratio (ref 0.0–5.0)
Creatinine, Ser: 1.25 mg/dL (ref 0.76–1.27)
Eos: 2 %
Estimated CHD Risk: <0.5 times avg. (ref 0.0–1.0)
Free Thyroxine Index: 1.7 (ref 1.2–4.9)
GGT: 15 IU/L (ref 0–65)
Glucose: 85 mg/dL (ref 70–99)
Hematocrit: 44.6 % (ref 37.5–51.0)
Iron: 118 ug/dL (ref 38–169)
LDH: 199 IU/L (ref 121–224)
LDL Chol Calc (NIH): 133 mg/dL — ABNORMAL HIGH (ref 0–99)
Lymphocytes Absolute: 1.6 10*3/uL (ref 0.7–3.1)
MCHC: 32.3 g/dL (ref 31.5–35.7)
MCV: 85 fL (ref 79–97)
Monocytes: 7 %
Phosphorus: 4.3 mg/dL — ABNORMAL HIGH (ref 2.8–4.1)
Platelets: 245 10*3/uL (ref 150–450)
Potassium: 4.6 mmol/L (ref 3.5–5.2)
Sodium: 143 mmol/L (ref 134–144)
T3 Uptake Ratio: 24 % (ref 24–39)
T4, Total: 7 ug/dL (ref 4.5–12.0)
TSH: 3.86 u[IU]/mL (ref 0.450–4.500)
Triglycerides: 67 mg/dL (ref 0–149)
Uric Acid: 5.8 mg/dL (ref 3.8–8.4)
VLDL Cholesterol Cal: 12 mg/dL (ref 5–40)
WBC: 5.7 10*3/uL (ref 3.4–10.8)
eGFR: 69 mL/min/{1.73_m2} (ref >59)

## 2021-12-26 LAB — CMP12+LP+TP+TSH+6AC+PSA+CBC?
Albumin: 4.7 g/dL (ref 3.8–4.9)
Basophils Absolute: 0.1 10*3/uL (ref 0.0–0.2)
Basos: 1 %
Bilirubin Total: 1.2 mg/dL (ref 0.0–1.2)
Chloride: 104 mmol/L (ref 96–106)
Cholesterol, Total: 208 mg/dL — ABNORMAL HIGH (ref 100–199)
EOS (ABSOLUTE): 0.1 10*3/uL (ref 0.0–0.4)
Globulin, Total: 2.3 g/dL (ref 1.5–4.5)
HDL: 63 mg/dL (ref >39)
Hemoglobin: 14.4 g/dL (ref 13.0–17.7)
Immature Grans (Abs): 0 10*3/uL (ref 0.0–0.1)
Immature Granulocytes: 0 %
Lymphs: 28 %
MCH: 27.3 pg (ref 26.6–33.0)
Monocytes Absolute: 0.4 10*3/uL (ref 0.1–0.9)
Neutrophils Absolute: 3.5 10*3/uL (ref 1.4–7.0)
Neutrophils: 62 %
Prostate Specific Ag, Serum: 1.3 ng/mL (ref 0.0–4.0)
RBC: 5.28 x10E6/uL (ref 4.14–5.80)
RDW: 11.5 % — ABNORMAL LOW (ref 11.6–15.4)
Total Protein: 7 g/dL (ref 6.0–8.5)

## 2022-01-02 ENCOUNTER — Encounter: Payer: 59 | Admitting: Physician Assistant

## 2022-01-07 ENCOUNTER — Ambulatory Visit: Payer: Self-pay | Admitting: Physician Assistant

## 2022-01-07 ENCOUNTER — Other Ambulatory Visit: Payer: Self-pay

## 2022-01-07 ENCOUNTER — Encounter: Payer: Self-pay | Admitting: Physician Assistant

## 2022-01-07 VITALS — BP 133/81 | HR 56 | Temp 97.6°F | Resp 12 | Ht 70.0 in | Wt 240.0 lb

## 2022-01-07 DIAGNOSIS — Z0000 Encounter for general adult medical examination without abnormal findings: Secondary | ICD-10-CM

## 2022-01-07 DIAGNOSIS — Z1211 Encounter for screening for malignant neoplasm of colon: Secondary | ICD-10-CM

## 2022-01-07 DIAGNOSIS — N522 Drug-induced erectile dysfunction: Secondary | ICD-10-CM

## 2022-01-07 MED ORDER — SILDENAFIL CITRATE 20 MG PO TABS: 20.0000 mg | ORAL_TABLET | Freq: Every day | ORAL | 11 refills | Status: DC | PRN

## 2022-01-07 NOTE — Progress Notes (Signed)
? ?[place] [facility] ? ?____________________________________________ ? ? None  ?  (approximate) ? ?I have reviewed the triage vital signs and the nursing notes. ? ? ?HISTORY ? ?Chief Complaint ?Annual Exam ? ? ?HPI ?Bryan Stephenson is a 51 y.o. male patient presents for annual physical exam.  Patient request consult for colonoscopy.  Patient also requests refill of  sildenafil ? ?  ? ? ?Past Medical History:  ?Diagnosis Date  ? Asthma   ? Complication of anesthesia   ? Hypertension   ? PONV (postoperative nausea and vomiting)   ? "violent" vomitting  ? ? ?Patient Active Problem List  ? Diagnosis Date Noted  ? Current tear knee, medial meniscus 02/09/2021  ? 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 Knee pain 02/09/2021  ? ? ?Past Surgical History:  ?Procedure Laterality Date  ? FRACTURE SURGERY Right 1984  ? FEMUR FX  ? KNEE ARTHROSCOPY Left 2014  ? pins and screws  ? KNEE ARTHROSCOPY WITH MEDIAL MENISECTOMY Right 10/23/2017  ? Procedure: KNEE ARTHROSCOPY WITH MEDIAL MENISECTOMY;  Surgeon: Thornton Park, MD;  Location: [facility] ORS;  Service: Orthopedics;  Laterality: Right;  ? ? ?Prior to Admission medications   ?Medication Sig Start Date End Date Taking? Authorizing Provider  ?bisoprolol-hydrochlorothiazide (ZIAC) 5-6.25 MG tablet Take 2 tablets by mouth every morning. 12/24/21  Yes Sable Feil, PA-C  ?sildenafil (REVATIO) 20 MG tablet Take 1 tablet (20 mg total) by mouth daily as needed (as needed for sexual activity). 01/07/22  Yes Sable Feil, PA-C  ?Multiple Vitamin (ONE-A-DAY MENS PO) Take 1 tablet by mouth daily.    [provider]  ?PARoxetine (PAXIL) 20 MG tablet Take 1 tablet (20 mg total) by mouth daily as needed (take 3-4 hours prior to intercourse for premature ejaculation). 03/27/21   Billey Co, MD  ? ? ?Allergies ?Bee venom, Penicillins, Shellfish allergy, and Sulfa antibiotics ? ?Family History  ?Problem Relation Age of Onset  ? Breast cancer Mother   ? Hyperlipidemia Father    ? Glaucoma Father   ? Lung cancer Maternal Grandfather   ? Diabetes Paternal Grandmother   ? ? ?Social History ?Social History  ? ?Tobacco Use  ? Smoking status: Never  ? Smokeless tobacco: Never  ?Vaping Use  ? Vaping Use: Never used  ?Substance Use Topics  ? Alcohol use: No  ? Drug use: No  ? ? ?Review of Systems ?Constitutional: No fever/chills ?Eyes: No visual changes. ?ENT: No sore throat. ?Cardiovascular: Denies chest pain. ?Respiratory: Denies shortness of breath. ?Gastrointestinal: No abdominal pain.  No nausea, no vomiting.  No diarrhea.  No constipation. ?Genitourinary: Negative for dysuria.  Erectile dysfunction ?Musculoskeletal: Negative for back pain. ?Skin: Negative for rash. ?Neurological: Negative for headaches, focal weakness or numbness. ?Endocrine: Hypertension ?Allergic/Immunilogical: Bee venom, penicillin, shellfish allergy, and sulfa antibiotics. ?____________________________________________ ? ? ?PHYSICAL EXAM: ? ?VITAL SIGNS:Temperature is 97.6, pulse 56, respiration 12, BP is 133/81, and patient is 95% O2 sat on room air.  Patient weighs 240 pounds and BMI is 34.44. ?Constitutional: Alert and oriented. Well appearing and in no acute distress. ?Eyes: Conjunctivae are normal. PERRL. EOMI. ?Head: Atraumatic. ?Nose: No congestion/rhinnorhea. ?Mouth/Throat: Mucous membranes are moist.  Oropharynx non-erythematous. ?Neck: No stridor.  No cervical spine tenderness to palpation. ?Hematological/Lymphatic/Immunilogical: No cervical lymphadenopathy. ?Cardiovascular: Bradycardic. Grossly normal heart sounds.  Good peripheral circulation. ?Respiratory: Normal respiratory effort.  No retractions. Lungs CTAB. ?Gastrointestinal: Soft and nontender. No distention. No abdominal bruits. No CVA tenderness. ?Genitourinary: Deferred ?Musculoskeletal: No lower extremity  tenderness nor edema.  No joint effusions. ?Neurologic:  Normal speech and language. No gross focal neurologic deficits are appreciated. No gait  instability. ?Skin:  Skin is warm, dry and intact. No rash noted. ?Psychiatric: Mood and affect are normal. Speech and behavior are normal. ? ?____________________________________________ ?  ?LABS ?      ?Component Ref Range & Units 13 d ago 1 yr ago 2 yr ago  ?Color, UA  yellow  yellow  Yellow   ?Clarity, UA  clear  clear  Clear   ?Glucose, UA Negative Negative  Negative  Negative   ?Bilirubin, UA  negaative  negative  Negative   ?Ketones, UA  negative  negative  Negative   ?Spec Grav, UA 1.010 - 1.025 >=1.030 Abnormal   >=1.030 Abnormal   >=1.030 Abnormal    ?Blood, UA  negative  negative  Negative   ?pH, UA 5.0 - 8.0 5.5  5.5  5.5   ?Protein, UA Negative Negative  Negative  Negative   ?Urobilinogen, UA 0.2 or 1.0 E.U./dL 0.2  0.2  0.2   ?Nitrite, UA  negative  negative  Negative   ?Leukocytes, UA Negative Negative  Negative  Negative   ?Appearance   medium     ?Odor        ?  ? ?  ?  ?Specimen Collected: 12/25/21 14:44 Last Resulted: 12/25/21 14:44  ?  ?  Lab Flowsheet   ? Order Details   ? View Encounter   ? Lab and Collection Details   ? Routing   ? Result History    ?View Encounter Conversation    ?  ?  ?Result Care Coordination ? ? ?Patient Communication ? ? Add Comments   Not seen Back to Top  ?  ?  ? ?Other Results from 12/25/2021 ? ? Contains abnormal data CMP12+LP+TP+TSH+6AC+PSA+CBC? ?Order: [id number] ?Status: Final result    ?Visible to patient: No (inaccessible in MyChart)    ?Next appt: 06/25/2022 at 08:30 AM in Urology Billey Co, MD)    ?Dx: Routine adult health maintenance    ?0 Result Notes ?         ?Component Ref Range & Units 13 d ago ?(12/25/21) 1 yr ago ?(11/17/20) 2 yr ago ?(10/01/19) 4 yr ago ?(10/22/17) 4 yr ago ?(10/22/17) 8 yr ago ?(09/28/13)  ?Glucose 70 - 99 mg/dL 85  87 R  89 R  96 R     ?Uric Acid 3.8 - 8.4 mg/dL 5.8  5.5 CM  6.6 CM      ?Comment:            Therapeutic target for gout patients: <6.0  ?BUN 6 - 24 mg/dL '19  16  23  16 ' R     ?Creatinine, Ser 0.76 - 1.27 mg/dL 1.25  1.22   1.26  1.18 R     ?eGFR >59 mL/min/1.73 69        ?BUN/Creatinine Ratio 9 - '20 15  13  18      ' ?Sodium 134 - 144 mmol/L 143  139  138  140 R     ?Potassium 3.5 - 5.2 mmol/L 4.6  4.3  3.9  4.1 R   4.1 R   ?Chloride 96 - 106 mmol/L 104  103  103  105 R     ?Calcium 8.7 - 10.2 mg/dL 9.8  9.7  9.2  9.1 R     ?Phosphorus 2.8 - 4.1 mg/dL 4.3 High   3.9  3.4      ?  Total Protein 6.0 - 8.5 g/dL 7.0  7.2  6.8      ?Albumin 3.8 - 4.9 g/dL 4.7  5.0 High   4.5 R      ?Globulin, Total 1.5 - 4.5 g/dL 2.3  2.2  2.3      ?Albumin/Globulin Ratio 1.2 - 2.2 2.0  2.3 High   2.0      ?Bilirubin Total 0.0 - 1.2 mg/dL 1.2  1.0  1.0      ?Alkaline Phosphatase 44 - 121 IU/L 67  65  57 R      ?LDH 121 - 224 IU/L 199  215  243 High       ?AST 0 - 40 IU/L 29  38  28      ?ALT 0 - 44 IU/L '26  23  20      ' ?GGT 0 - 65 IU/L '15  14  16      ' ?Iron 38 - 169 ug/dL 118  90  110      ?Cholesterol, Total 100 - 199 mg/dL 208 High   207 High   204 High       ?Triglycerides 0 - 149 mg/dL 67  42  67      ?HDL >39 mg/dL 63  70  58      ?VLDL Cholesterol Cal 5 - 40 mg/dL '12  8  12      ' ?LDL Chol Calc (NIH) 0 - 99 mg/dL 133 High   129 High   134 High       ?Chol/HDL Ratio 0.0 - 5.0 ratio 3.3  3.0 CM  3.5 CM      ?Comment:                                   T. Chol/HDL Ratio  ?                                            Men  Women  ?                              1/2 Avg.Risk  3.4    3.3  ?                                  Avg.Risk  5.0    4.4  ?                               2X Avg.Risk  9.6    7.1  ?                               3X Avg.Risk 23.4   11.0   ?Estimated CHD Risk 0.0 - 1.0 times avg.  < 0.5   < 0.5 CM  0.6 CM      ?Comment: The CHD Risk is based on the T. Chol/HDL ratio. Other  ?factors affect CHD Risk such as hypertension, smoking,  ?diabetes, severe obesity, and family history of  ?premature CHD.   ?TSH 0.450 - 4.500 uIU/mL 3.860  3.140  2.700      ?T4, Total 4.5 - 12.0 ug/dL 7.0  6.3  6.0      ?T3 Uptake Ratio 24 - 39 % '24  24  25      ' ?Free  Thyroxine Index 1.2 - 4.9 1.7  1.5  1.5      ?Prostate Specific Ag, Serum 0.0 - 4.0 ng/mL 1.3  1.2 CM  1.2 CM      ?Comment: Roche ECLIA methodology.  ?According to the American Urological Association, Serum PS

## 2022-01-07 NOTE — Progress Notes (Signed)
Pt denies any issues or concerns but does want to go ahead and get scheduled for his colonoscopy. ? ?

## 2022-01-07 NOTE — Addendum Note (Signed)
Addended by: Aliene Altes on: 01/07/2022 04:40 PM ? ? Modules accepted: Orders ? ?

## 2022-01-07 NOTE — Addendum Note (Signed)
Addended by: Aliene Altes on: 01/07/2022 04:59 PM ? ? Modules accepted: Orders ? ?

## 2022-02-27 DIAGNOSIS — B351 Tinea unguium: Secondary | ICD-10-CM | POA: Diagnosis not present

## 2022-02-27 DIAGNOSIS — L601 Onycholysis: Secondary | ICD-10-CM | POA: Diagnosis not present

## 2022-02-27 DIAGNOSIS — M79675 Pain in left toe(s): Secondary | ICD-10-CM | POA: Diagnosis not present

## 2022-02-27 DIAGNOSIS — M79674 Pain in right toe(s): Secondary | ICD-10-CM | POA: Diagnosis not present

## 2022-04-15 DIAGNOSIS — M79674 Pain in right toe(s): Secondary | ICD-10-CM | POA: Diagnosis not present

## 2022-04-15 DIAGNOSIS — M79675 Pain in left toe(s): Secondary | ICD-10-CM | POA: Diagnosis not present

## 2022-04-15 DIAGNOSIS — B351 Tinea unguium: Secondary | ICD-10-CM | POA: Diagnosis not present

## 2022-04-27 ENCOUNTER — Other Ambulatory Visit: Payer: Self-pay | Admitting: Urology

## 2022-04-27 DIAGNOSIS — F524 Premature ejaculation: Secondary | ICD-10-CM

## 2022-05-16 DIAGNOSIS — M79674 Pain in right toe(s): Secondary | ICD-10-CM | POA: Diagnosis not present

## 2022-05-16 DIAGNOSIS — B351 Tinea unguium: Secondary | ICD-10-CM | POA: Diagnosis not present

## 2022-05-16 DIAGNOSIS — M79675 Pain in left toe(s): Secondary | ICD-10-CM | POA: Diagnosis not present

## 2022-05-29 ENCOUNTER — Other Ambulatory Visit: Payer: Self-pay | Admitting: Urology

## 2022-05-29 DIAGNOSIS — F524 Premature ejaculation: Secondary | ICD-10-CM

## 2022-06-13 DIAGNOSIS — Z1211 Encounter for screening for malignant neoplasm of colon: Secondary | ICD-10-CM | POA: Diagnosis not present

## 2022-06-13 DIAGNOSIS — Z01818 Encounter for other preprocedural examination: Secondary | ICD-10-CM | POA: Diagnosis not present

## 2022-06-25 ENCOUNTER — Ambulatory Visit: Payer: 59 | Admitting: Urology

## 2022-08-12 ENCOUNTER — Other Ambulatory Visit: Payer: Self-pay

## 2022-08-12 NOTE — Progress Notes (Signed)
UDS pending with [facility]  ETOH is cleared.

## 2022-09-24 ENCOUNTER — Encounter
Admission: RE | Disposition: A | Discharge status: Home / Self Care | Payer: Self-pay | Source: Home / Self Care | Attending: Gastroenterology

## 2022-09-24 ENCOUNTER — Ambulatory Visit: Payer: 59 | Admitting: Certified Registered Nurse Anesthetist

## 2022-09-24 ENCOUNTER — Encounter: Payer: Self-pay | Admitting: *Deleted

## 2022-09-24 ENCOUNTER — Other Ambulatory Visit: Payer: Self-pay

## 2022-09-24 ENCOUNTER — Ambulatory Visit
Admission: RE | Admit: 2022-09-24 | Discharge: 2022-09-24 | Disposition: A | Discharge status: Home / Self Care | Payer: 59 | Attending: Gastroenterology | Admitting: Gastroenterology

## 2022-09-24 DIAGNOSIS — E669 Obesity, unspecified: Secondary | ICD-10-CM | POA: Diagnosis not present

## 2022-09-24 DIAGNOSIS — Z1211 Encounter for screening for malignant neoplasm of colon: Secondary | ICD-10-CM | POA: Insufficient documentation

## 2022-09-24 DIAGNOSIS — K640 First degree hemorrhoids: Secondary | ICD-10-CM | POA: Insufficient documentation

## 2022-09-24 DIAGNOSIS — I10 Essential (primary) hypertension: Secondary | ICD-10-CM | POA: Insufficient documentation

## 2022-09-24 DIAGNOSIS — Z6833 Body mass index (BMI) 33.0-33.9, adult: Secondary | ICD-10-CM | POA: Diagnosis not present

## 2022-09-24 DIAGNOSIS — K635 Polyp of colon: Secondary | ICD-10-CM | POA: Insufficient documentation

## 2022-09-24 DIAGNOSIS — D126 Benign neoplasm of colon, unspecified: Secondary | ICD-10-CM | POA: Diagnosis not present

## 2022-09-24 HISTORY — PX: COLONOSCOPY WITH PROPOFOL: SHX5780

## 2022-09-24 SURGERY — COLONOSCOPY WITH PROPOFOL
Anesthesia: General

## 2022-09-24 MED ORDER — LIDOCAINE HCL (CARDIAC) PF 100 MG/5ML IV SOSY
PREFILLED_SYRINGE | INTRAVENOUS | Status: DC | PRN
  Administered 2022-09-24: 50 mg via INTRAVENOUS

## 2022-09-24 MED ORDER — STERILE WATER FOR IRRIGATION IR SOLN
Status: DC | PRN
  Administered 2022-09-24: 60 mL

## 2022-09-24 MED ORDER — PROPOFOL 10 MG/ML IV BOLUS
INTRAVENOUS | Status: DC | PRN
  Administered 2022-09-24: 100 mg via INTRAVENOUS

## 2022-09-24 MED ORDER — SODIUM CHLORIDE 0.9 % IV SOLN: INTRAVENOUS | Status: DC

## 2022-09-24 MED ORDER — PROPOFOL 500 MG/50ML IV EMUL
INTRAVENOUS | Status: DC | PRN
  Administered 2022-09-24: 175 ug/kg/min via INTRAVENOUS

## 2022-09-24 MED ORDER — LIDOCAINE HCL (PF) 2 % IJ SOLN
INTRAMUSCULAR | Status: AC
  Filled 2022-09-24: qty 5

## 2022-09-24 MED ORDER — PROPOFOL 1000 MG/100ML IV EMUL
INTRAVENOUS | Status: AC
  Filled 2022-09-24: qty 100

## 2022-09-24 NOTE — Anesthesia Procedure Notes (Signed)
Date/Time: 09/24/2022 9:30 AM  Performed by: Ginger Carne, CRNAPre-anesthesia Checklist: Patient identified, Emergency Drugs available, Suction available, Patient being monitored and Timeout performed Patient Re-evaluated:Patient Re-evaluated prior to induction Oxygen Delivery Method: Nasal cannula Preoxygenation: Pre-oxygenation with 100% oxygen Induction Type: IV induction

## 2022-09-24 NOTE — Transfer of Care (Signed)
Immediate Anesthesia Transfer of Care Note  Patient: Safwan De Burrs.  Procedure(s) Performed: COLONOSCOPY WITH PROPOFOL  Patient Location: PACU  Anesthesia Type:General  Level of Consciousness: drowsy  Airway & Oxygen Therapy: Patient Spontanous Breathing  Post-op Assessment: Report given to RN and Post -op Vital signs reviewed and stable  Post vital signs: Reviewed and stable  Last Vitals:  Vitals Value Taken Time  BP 90/60 09/24/22 0955  Temp    Pulse 58 09/24/22 0955  Resp 16 09/24/22 0955  SpO2 100 % 09/24/22 0955    Last Pain:  Vitals:   09/24/22 0905  TempSrc: Temporal  PainSc: 0-No pain         Complications: No notable events documented.

## 2022-09-24 NOTE — Anesthesia Preprocedure Evaluation (Signed)
Anesthesia Evaluation  Patient identified by MRN, date of birth, ID band Patient awake    Reviewed: Allergy & Precautions, NPO status , Patient's Chart, lab work & pertinent test results  History of Anesthesia Complications (+) PONV and history of anesthetic complications  Airway Mallampati: III  TM Distance: >3 FB Neck ROM: Full    Dental  (+)    Pulmonary neg pulmonary ROS, neg shortness of breath, neg sleep apnea, neg COPD, neg recent URI   breath sounds clear to auscultation- rhonchi (-) wheezing      Cardiovascular Exercise Tolerance: Good hypertension, Pt. on medications (-) angina (-) CAD, (-) Past MI, (-) Cardiac Stents and (-) CABG  Rhythm:Regular Rate:Normal - Systolic murmurs and - Diastolic murmurs    Neuro/Psych negative neurological ROS  negative psych ROS   GI/Hepatic negative GI ROS, Neg liver ROS,,,  Endo/Other  negative endocrine ROSneg diabetes    Renal/GU negative Renal ROS     Musculoskeletal negative musculoskeletal ROS (+)    Abdominal  (+) + obese  Peds  Hematology negative hematology ROS (+)   Anesthesia Other Findings Past Medical History: No date: Complication of anesthesia No date: Hypertension No date: PONV (postoperative nausea and vomiting)     Comment:  "violent" vomitting   Reproductive/Obstetrics                             Anesthesia Physical Anesthesia Plan  ASA: 2  Anesthesia Plan: General   Post-op Pain Management:    Induction: Intravenous  PONV Risk Score and Plan: 2 and Propofol infusion and TIVA  Airway Management Planned: Natural Airway and Nasal Cannula  Additional Equipment:   Intra-op Plan:   Post-operative Plan:   Informed Consent: I have reviewed the patients History and Physical, chart, labs and discussed the procedure including the risks, benefits and alternatives for the proposed anesthesia with the patient or authorized  representative who has indicated his/her understanding and acceptance.     Dental advisory given  Plan Discussed with: CRNA and Anesthesiologist  Anesthesia Plan Comments:         Anesthesia Quick Evaluation

## 2022-09-24 NOTE — Op Note (Signed)
[facility]enter[facility] Patient Name: Bryan Stephenson Procedure Date: 09/24/2022 9:02 AM MRN: [id number] Account #: [id number] Date of Birth: 1971-04-21 Admit Type: Outpatient Age: 51 Room: [facility] ENDO ROOM 1 Gender: Male Note Status: Finalized Instrument Name: Park [id number] [id number] Procedure:             Colonoscopy Indications:           Screening for colorectal malignant neoplasm Providers:             Andrey Farmer MD, MD Referring MD:          No Local Md, MD (Referring MD) Medicines:             Monitored Anesthesia Care Complications:         No immediate complications. Estimated blood loss:                         Minimal. Procedure:             Pre-Anesthesia Assessment:                        - Prior to the procedure, a History and Physical was                         performed, and patient medications and allergies were                         reviewed. The patient is competent. The risks and                         benefits of the procedure and the sedation options and                         risks were discussed with the patient. All questions                         were answered and informed consent was obtained.                         Patient identification and proposed procedure were                         verified by the physician, the nurse, the                         anesthesiologist, the anesthetist and the technician                         in the endoscopy suite. Mental Status Examination:                         alert and oriented. Airway Examination: normal                         oropharyngeal airway and neck mobility. Respiratory                         Examination: clear to auscultation. CV Examination:  normal. Prophylactic Antibiotics: The patient does not                         require prophylactic antibiotics. Prior                         Anticoagulants: The patient has taken no anticoagulant                          or antiplatelet agents. ASA Grade Assessment: I - A                         normal, healthy patient. After reviewing the risks and                         benefits, the patient was deemed in satisfactory                         condition to undergo the procedure. The anesthesia                         plan was to use monitored anesthesia care (MAC).                         Immediately prior to administration of medications,                         the patient was re-assessed for adequacy to receive                         sedatives. The heart rate, respiratory rate, oxygen                         saturations, blood pressure, adequacy of pulmonary                         ventilation, and response to care were monitored                         throughout the procedure. The physical status of the                         patient was re-assessed after the procedure.                        After obtaining informed consent, the colonoscope was                         passed under direct vision. Throughout the procedure,                         the patient's blood pressure, pulse, and oxygen                         saturations were monitored continuously. The                         Colonoscope was introduced through the anus and  advanced to the the cecum, identified by appendiceal                         orifice and ileocecal valve. The colonoscopy was                         performed without difficulty. The patient tolerated                         the procedure well. The quality of the bowel                         preparation was excellent. The ileocecal valve,                         appendiceal orifice, and rectum were photographed. Findings:      The perianal and digital rectal examinations were normal.      A 2 mm polyp was found in the sigmoid colon. The polyp was hyperplastic.       The polyp was removed with a cold snare. Resection and retrieval  were       complete. Estimated blood loss was minimal.      Internal hemorrhoids were found during retroflexion. The hemorrhoids       were Grade I (internal hemorrhoids that do not prolapse).      The exam was otherwise without abnormality on direct and retroflexion       views. Impression:            - One 2 mm polyp in the sigmoid colon, removed with a                         cold snare. Resected and retrieved.                        - Internal hemorrhoids.                        - The examination was otherwise normal on direct and                         retroflexion views. Recommendation:        - Discharge patient to home.                        - Resume previous diet.                        - Continue present medications.                        - Await pathology results.                        - Repeat colonoscopy in 10 years for screening                         purposes.                        - Return to referring physician as previously  scheduled. Procedure Code(s):     --- Professional ---                        [phone], Colonoscopy, flexible; with removal of                         tumor(s), polyp(s), or other lesion(s) by snare                         technique Diagnosis Code(s):     --- Professional ---                        Z12.11, Encounter for screening for malignant neoplasm                         of colon                        D12.5, Benign neoplasm of sigmoid colon                        K64.0, First degree hemorrhoids CPT copyright 2022 American [employer] Association. All rights reserved. The codes documented in this report are preliminary and upon coder review may  be revised to meet current compliance requirements. Andrey Farmer MD, MD 09/24/2022 9:52:40 AM Number of Addenda: 0 Note Initiated On: 09/24/2022 9:02 AM Scope Withdrawal Time: 0 hours 9 minutes 20 seconds  Total Procedure Duration: 0 hours 12 minutes 49 seconds  Estimated  Blood Loss:  Estimated blood loss was minimal.      [facility]

## 2022-09-24 NOTE — Interval H&P Note (Signed)
History and Physical Interval Note:  09/24/2022 9:22 AM  Bryan Stephenson.  has presented today for surgery, with the diagnosis of colon cancer screening.  The various methods of treatment have been discussed with the patient and family. After consideration of risks, benefits and other options for treatment, the patient has consented to  Procedure(s): COLONOSCOPY WITH PROPOFOL (N/A) as a surgical intervention.  The patient's history has been reviewed, patient examined, no change in status, stable for surgery.  I have reviewed the patient's chart and labs.  Questions were answered to the patient's satisfaction.     Regis Bill  Ok to proceed with colonoscopy

## 2022-09-24 NOTE — H&P (Signed)
Outpatient short stay form Pre-procedure 09/24/2022  Regis Bill, MD  Primary Physician: Patient, No Pcp Per  Reason for visit:  Screening  History of present illness:    51 y/o gentleman with history of hypertension here for index screening colonoscopy. No blood thinners. No family history of GI malignancies. No significant abdominal surgeries. No family history of colon cancer.    Current Facility-Administered Medications:    0.9 %  sodium chloride infusion, , Intravenous, Continuous, Roanne Haye, Rossie Muskrat, MD, Last Rate: 20 mL/hr at 09/24/22 0918, New Bag at 09/24/22 0918  Medications Prior to Admission  Medication Sig Dispense Refill Last Dose   bisoprolol-hydrochlorothiazide (ZIAC) 5-6.25 MG tablet Take 2 tablets by mouth every morning. 180 tablet 2 09/24/2022 at 0715   Multiple Vitamin (ONE-A-DAY MENS PO) Take 1 tablet by mouth daily.   Past Week   PARoxetine (PAXIL) 20 MG tablet TAKE 1 TABLET BY MOUTH DAILY AS NEEDED (3-4 HOURS PRIOR TO INTERCOURSE FOR PREMATURE EJACULATION). 90 tablet 1 Past Week   sildenafil (REVATIO) 20 MG tablet Take 1 tablet (20 mg total) by mouth daily as needed (as needed for sexual activity). 30 tablet 11 09/23/2022     Allergies  Allergen Reactions   Bee Venom Anaphylaxis    Severe swelling of throat and lips   Penicillins Anaphylaxis, Swelling and Other (See Comments)    Has patient had a PCN reaction causing immediate rash, facial/tongue/throat swelling, SOB or lightheadedness with hypotension: Yes Has patient had a PCN reaction causing severe rash involving mucus membranes or skin necrosis: No Has patient had a PCN reaction that required hospitalization: No Has patient had a PCN reaction occurring within the last 10 years: No If all of the above answers are "NO", then may proceed with Cephalosporin use.      Past Medical History:  Diagnosis Date   Asthma    Complication of anesthesia    Hypertension    PONV (postoperative nausea and  vomiting)    "violent" vomitting    Review of systems:  Otherwise negative.    Physical Exam  Gen: Alert, oriented. Appears stated age.  HEENT: PERRLA. Lungs: No respiratory distress CV: RRR Abd: soft, benign, no masses Ext: No edema    Planned procedures: Proceed with colonoscopy. The patient understands the nature of the planned procedure, indications, risks, alternatives and potential complications including but not limited to bleeding, infection, perforation, damage to internal organs and possible oversedation/side effects from anesthesia. The patient agrees and gives consent to proceed.  Please refer to procedure notes for findings, recommendations and patient disposition/instructions.     Regis Bill, MD [facility]enter[facility]

## 2022-09-25 ENCOUNTER — Encounter: Payer: Self-pay | Admitting: Gastroenterology

## 2022-09-25 LAB — SURGICAL PATHOLOGY

## 2022-09-26 NOTE — Anesthesia Postprocedure Evaluation (Signed)
Anesthesia Post Note  Patient: Bryan Stephenson.  Procedure(s) Performed: COLONOSCOPY WITH PROPOFOL  Patient location during evaluation: Endoscopy Anesthesia Type: General Level of consciousness: awake and alert Pain management: pain level controlled Vital Signs Assessment: post-procedure vital signs reviewed and stable Respiratory status: spontaneous breathing, nonlabored ventilation, respiratory function stable and patient connected to nasal cannula oxygen Cardiovascular status: blood pressure returned to baseline and stable Postop Assessment: no apparent nausea or vomiting Anesthetic complications: no   No notable events documented.   Last Vitals:  Vitals:   09/24/22 1005 09/24/22 1014  BP: 98/72 126/88  Pulse: (!) 50 (!) 52  Resp: 20 20  Temp:    SpO2: 100% 100%    Last Pain:  Vitals:   09/24/22 1005  TempSrc:   PainSc: 0-No pain                 Lenard Simmer

## 2022-12-20 ENCOUNTER — Other Ambulatory Visit: Payer: Self-pay | Admitting: Physician Assistant

## 2022-12-20 DIAGNOSIS — I10 Essential (primary) hypertension: Secondary | ICD-10-CM

## 2023-01-06 ENCOUNTER — Other Ambulatory Visit: Payer: Self-pay

## 2023-01-06 NOTE — Telephone Encounter (Signed)
Duplicate request  Pharmacy sent an electronic refill request - routed to Mardee Postin, PA-C.  [facility]

## 2023-01-14 ENCOUNTER — Ambulatory Visit: Payer: Self-pay

## 2023-01-14 DIAGNOSIS — Z0000 Encounter for general adult medical examination without abnormal findings: Secondary | ICD-10-CM

## 2023-01-14 LAB — POCT URINALYSIS DIPSTICK
Bilirubin, UA: NEGATIVE
Blood, UA: NEGATIVE
Glucose, UA: NEGATIVE
Ketones, UA: NEGATIVE
Leukocytes, UA: NEGATIVE
Nitrite, UA: NEGATIVE
Protein, UA: NEGATIVE
Spec Grav, UA: 1.025 (ref 1.010–1.025)
Urobilinogen, UA: 0.2 E.U./dL
pH, UA: 5.5 (ref 5.0–8.0)

## 2023-01-14 NOTE — Progress Notes (Signed)
Pt completed labs for physical.

## 2023-01-15 LAB — CMP12+LP+TP+TSH+6AC+PSA+CBC…
ALT: 25 IU/L (ref 0–44)
AST: 29 IU/L (ref 0–40)
Albumin/Globulin Ratio: 1.9 (ref 1.2–2.2)
Albumin: 4.2 g/dL (ref 3.8–4.9)
Alkaline Phosphatase: 70 IU/L (ref 44–121)
BUN/Creatinine Ratio: 11 (ref 9–20)
BUN: 14 mg/dL (ref 6–24)
Basophils Absolute: 0.1 10*3/uL (ref 0.0–0.2)
Basos: 1 %
Bilirubin Total: 0.9 mg/dL (ref 0.0–1.2)
Calcium: 9.3 mg/dL (ref 8.7–10.2)
Chloride: 106 mmol/L (ref 96–106)
Chol/HDL Ratio: 3 ratio (ref 0.0–5.0)
Cholesterol, Total: 179 mg/dL (ref 100–199)
Creatinine, Ser: 1.22 mg/dL (ref 0.76–1.27)
EOS (ABSOLUTE): 0.2 10*3/uL (ref 0.0–0.4)
Eos: 3 %
Estimated CHD Risk: <0.5 times avg. (ref 0.0–1.0)
Free Thyroxine Index: 1.5 (ref 1.2–4.9)
GGT: 16 IU/L (ref 0–65)
Globulin, Total: 2.2 g/dL (ref 1.5–4.5)
Glucose: 90 mg/dL (ref 70–99)
HDL: 59 mg/dL (ref >39)
Hematocrit: 39.6 % (ref 37.5–51.0)
Hemoglobin: 13.1 g/dL (ref 13.0–17.7)
Immature Grans (Abs): 0 10*3/uL (ref 0.0–0.1)
Immature Granulocytes: 1 %
Iron: 113 ug/dL (ref 38–169)
LDH: 199 IU/L (ref 121–224)
LDL Chol Calc (NIH): 104 mg/dL — ABNORMAL HIGH (ref 0–99)
Lymphocytes Absolute: 1.4 10*3/uL (ref 0.7–3.1)
Lymphs: 27 %
MCH: 27.5 pg (ref 26.6–33.0)
MCHC: 33.1 g/dL (ref 31.5–35.7)
MCV: 83 fL (ref 79–97)
Monocytes Absolute: 0.4 10*3/uL (ref 0.1–0.9)
Monocytes: 7 %
Neutrophils Absolute: 3.2 10*3/uL (ref 1.4–7.0)
Neutrophils: 61 %
Phosphorus: 2.3 mg/dL — ABNORMAL LOW (ref 2.8–4.1)
Platelets: 219 10*3/uL (ref 150–450)
Potassium: 3.9 mmol/L (ref 3.5–5.2)
Prostate Specific Ag, Serum: 1.3 ng/mL (ref 0.0–4.0)
RBC: 4.77 x10E6/uL (ref 4.14–5.80)
RDW: 11.9 % (ref 11.6–15.4)
Sodium: 141 mmol/L (ref 134–144)
T3 Uptake Ratio: 25 % (ref 24–39)
T4, Total: 5.9 ug/dL (ref 4.5–12.0)
TSH: 3.85 u[IU]/mL (ref 0.450–4.500)
Total Protein: 6.4 g/dL (ref 6.0–8.5)
Triglycerides: 86 mg/dL (ref 0–149)
Uric Acid: 5.4 mg/dL (ref 3.8–8.4)
VLDL Cholesterol Cal: 16 mg/dL (ref 5–40)
WBC: 5.2 10*3/uL (ref 3.4–10.8)
eGFR: 71 mL/min/{1.73_m2} (ref >59)

## 2023-01-21 ENCOUNTER — Encounter: Payer: Self-pay | Admitting: Physician Assistant

## 2023-01-21 ENCOUNTER — Ambulatory Visit: Payer: Self-pay | Admitting: Physician Assistant

## 2023-01-21 VITALS — BP 128/86 | HR 51 | Temp 97.3°F | Resp 16 | Ht 70.0 in | Wt 244.0 lb

## 2023-01-21 DIAGNOSIS — Z0000 Encounter for general adult medical examination without abnormal findings: Secondary | ICD-10-CM

## 2023-01-21 NOTE — Progress Notes (Signed)
[facility] ____________________________________________   None    (approximate)  I have reviewed the triage vital signs and the nursing notes.   HISTORY  Chief Complaint Annual Exam   HPI Bryan Stephenson. is a 52 y.o. male who presents for annual physical. Appears in good general health, not in any acute distress.          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   Knee pain 02/09/2021    Past Surgical History:  Procedure Laterality Date   COLONOSCOPY WITH PROPOFOL N/A 09/24/2022   Procedure: COLONOSCOPY WITH PROPOFOL;  Surgeon: Lesly Rubenstein, MD;  Location: [facility] ENDOSCOPY;  Service: Endoscopy;  Laterality: N/A;   FRACTURE SURGERY Right 1984   FEMUR FX   KNEE ARTHROSCOPY Left 2014   pins and screws   KNEE ARTHROSCOPY WITH MEDIAL MENISECTOMY Right 10/23/2017   Procedure: KNEE ARTHROSCOPY WITH MEDIAL MENISECTOMY;  Surgeon: Thornton Park, MD;  Location: [facility] ORS;  Service: Orthopedics;  Laterality: Right;    Prior to Admission medications   Medication Sig Start Date End Date Taking? Authorizing Provider  bisoprolol-hydrochlorothiazide [facility]) 5-6.25 MG tablet TAKE 2 TABLETS BY MOUTH EVERY MORNING 01/06/23   Sable Feil, PA-C  Multiple Vitamin (ONE-A-DAY MENS PO) Take 1 tablet by mouth daily.    [provider]  PARoxetine (PAXIL) 20 MG tablet TAKE 1 TABLET BY MOUTH DAILY AS NEEDED (3-4 HOURS PRIOR TO INTERCOURSE FOR PREMATURE EJACULATION). 05/30/22   Billey Co, MD  sildenafil (REVATIO) 20 MG tablet Take 1 tablet (20 mg total) by mouth daily as needed (as needed for sexual activity). 01/07/22   Sable Feil, PA-C    Allergies Bee venom and  Penicillins  Family History  Problem Relation Age of Onset   Breast cancer Mother    Hyperlipidemia Father    Glaucoma Father    Lung cancer Maternal Grandfather    Diabetes Paternal Grandmother     Social History Social History   Tobacco Use   Smoking status: Never   Smokeless tobacco: Never  Vaping Use   Vaping Use: Never used  Substance Use Topics   Alcohol use: No   Drug use: No    Review of Systems Constitutional: No fever/chills Eyes: No visual changes. ENT: No sore throat. Cardiovascular: Denies chest pain. Respiratory: Denies shortness of breath. Gastrointestinal: No abdominal pain.  No nausea, no vomiting.  No diarrhea.  No constipation. Genitourinary: Negative for dysuria. Musculoskeletal: Negative for back pain. Skin: Negative for rash. Neurological: Negative for headaches, focal weakness or numbness.  ____________________________________________   PHYSICAL EXAM:  VITAL SIGNS: BP 128/86, P 51, T 97.3f, SpO2 98%, WT 244lb, BMI 35  Constitutional: Alert and oriented. Well appearing and in no acute distress. Eyes: Conjunctivae are normal. PERRL. EOMI. Head: Atraumatic. Nose: No congestion/rhinnorhea. Mouth/Throat: Mucous membranes are moist.  Oropharynx non-erythematous. Neck: No stridor. No cervical spine tenderness to palpation Hematological/Lymphatic/Immunilogical: No cervical lymphadenopathy. Cardiovascular: Normal rate, regular rhythm. Grossly normal heart sounds.  Good peripheral circulation. Respiratory: Normal respiratory effort.  No retractions. Lungs CTAB. Gastrointestinal: Soft and nontender. No distention. No abdominal bruits. No CVA tenderness. Musculoskeletal: No lower extremity tenderness nor edema.  No joint  effusions. Neurologic:  Normal speech and language. No gross focal neurologic deficits are appreciated. No gait instability. Skin:  Skin is warm, dry and intact. No rash noted. Psychiatric: Mood and affect are normal. Speech and  behavior are normal.  ____________________________________________   LABS ____       Component Ref Range & Units 7 d ago 1 yr ago 2 yr ago 3 yr ago  Color, UA yellow yellow yellow Yellow  Clarity, UA clear clear clear Clear  Glucose, UA Negative Negative Negative Negative Negative  Bilirubin, UA neg negaative negative Negative  Ketones, UA neg negative negative Negative  Spec Grav, UA 1.010 - 1.025 1.025 >=1.030 Abnormal  >=1.030 Abnormal  >=1.030 Abnormal   Blood, UA neg negative negative Negative  pH, UA 5.0 - 8.0 5.5 5.5 5.5 5.5  Protein, UA Negative Negative Negative Negative Negative  Urobilinogen, UA 0.2 or 1.0 E.U./dL 0.2 0.2 0.2 0.2  Nitrite, UA neg negative negative Negative  Leukocytes, UA Negative Negative Negative Negative Negative  Appearance dark  medium   Odor                       Component Ref Range & Units 7 d ago (01/14/23) 1 yr ago (12/25/21) 2 yr ago (11/17/20) 3 yr ago (10/01/19) 5 yr ago (10/22/17) 5 yr ago (10/22/17) 9 yr ago (09/28/13)  Glucose 70 - 99 mg/dL 90 85 87 R 89 R 96 R    Uric Acid 3.8 - 8.4 mg/dL 5.4 5.8 CM 5.5 CM 6.6 CM     Comment:            Therapeutic target for gout patients: <6.0  BUN 6 - 24 mg/dL 14 19 16 23 16  R    Creatinine, Ser 0.76 - 1.27 mg/dL 1.22 1.25 1.22 1.26 1.18 R    eGFR >59 mL/min/1.73 71 69       BUN/Creatinine Ratio 9 - 20 11 15 13 18      Sodium 134 - 144 mmol/L 141 143 139 138 140 R    Potassium 3.5 - 5.2 mmol/L 3.9 4.6 4.3 3.9 4.1 R  4.1 R  Chloride 96 - 106 mmol/L 106 104 103 103 105 R    Calcium 8.7 - 10.2 mg/dL 9.3 9.8 9.7 9.2 9.1 R    Phosphorus 2.8 - 4.1 mg/dL 2.3 Low  4.3 High  3.9 3.4     Total Protein 6.0 - 8.5 g/dL 6.4 7.0 7.2 6.8     Albumin 3.8 - 4.9 g/dL 4.2 4.7 5.0 High  4.5 R     Globulin, Total 1.5 - 4.5 g/dL 2.2 2.3 2.2 2.3     Albumin/Globulin Ratio 1.2 - 2.2 1.9 2.0 2.3 High  2.0     Bilirubin Total 0.0 - 1.2 mg/dL 0.9 1.2 1.0 1.0     Alkaline Phosphatase 44 - 121 IU/L  70 67 65 57 R     LDH 121 - 224 IU/L 199 199 215 243 High      AST 0 - 40 IU/L 29 29 38 28     ALT 0 - 44 IU/L 25 26 23 20      GGT 0 - 65 IU/L 16 15 14 16      Iron 38 - 169 ug/dL 113 118 90 110     Cholesterol, Total 100 - 199 mg/dL 179 208 High  207 High  204 High      Triglycerides 0 - 149 mg/dL 86 67 42  67     HDL >39 mg/dL 59 63 70 58     VLDL Cholesterol Cal 5 - 40 mg/dL 16 12 8 12      LDL Chol Calc (NIH) 0 - 99 mg/dL 104 High  133 High  129 High  134 High      Chol/HDL Ratio 0.0 - 5.0 ratio 3.0 3.3 CM 3.0 CM 3.5 CM     Comment:                                   T. Chol/HDL Ratio                                             Men  Women                               1/2 Avg.Risk  3.4    3.3                                   Avg.Risk  5.0    4.4                                2X Avg.Risk  9.6    7.1                                3X Avg.Risk 23.4   11.0  Estimated CHD Risk 0.0 - 1.0 times avg.  < 0.5  < 0.5 CM  < 0.5 CM 0.6 CM     Comment: The CHD Risk is based on the T. Chol/HDL ratio. Other factors affect CHD Risk such as hypertension, smoking, diabetes, severe obesity, and family history of premature CHD.  TSH 0.450 - 4.500 uIU/mL 3.850 3.860 3.140 2.700     T4, Total 4.5 - 12.0 ug/dL 5.9 7.0 6.3 6.0     T3 Uptake Ratio 24 - 39 % 25 24 24 25      Free Thyroxine Index 1.2 - 4.9 1.5 1.7 1.5 1.5     Prostate Specific Ag, Serum 0.0 - 4.0 ng/mL 1.3 1.3 CM 1.2 CM 1.2 CM     Comment: Roche ECLIA methodology. According to the American Urological Association, Serum PSA should decrease and remain at undetectable levels after radical prostatectomy. The AUA defines biochemical recurrence as an initial PSA value 0.2 ng/mL or greater followed by a subsequent confirmatory PSA value 0.2 ng/mL or greater. Values obtained with different assay methods or kits cannot be used interchangeably. Results cannot be interpreted as absolute evidence of the presence or absence of malignant  disease.  WBC 3.4 - 10.8 x10E3/uL 5.2 5.7 6.3 6.1  7.1 R   RBC 4.14 - 5.80 x10E6/uL 4.77 5.28 5.21 4.84  4.94 R   Hemoglobin 13.0 - 17.7 g/dL 13.1 14.4 14.3 13.5  13.7 R   Hematocrit 37.5 - 51.0 % 39.6 44.6 43.5 40.6  41.7 R   MCV 79 - 97 fL 83 85 84 84  84.4 R   MCH 26.6 - 33.0 pg 27.5 27.3 27.4 27.9  27.8  R   MCHC 31.5 - 35.7 g/dL 33.1 32.3 32.9 33.3  32.9 R   RDW 11.6 - 15.4 % 11.9 11.5 Low  11.5 Low  11.7  12.5 R   Platelets 150 - 450 x10E3/uL 219 245 264 242  239 R   Neutrophils Not Estab. % 61 62 61 61  63 R   Lymphs Not Estab. % 27 28 26 27      Monocytes Not Estab. % 7 7 7 8      Eos Not Estab. % 3 2 4 2      Basos Not Estab. % 1 1 1 1      Neutrophils Absolute 1.4 - 7.0 x10E3/uL 3.2 3.5 3.9 3.7  4.4 R   Lymphocytes Absolute 0.7 - 3.1 x10E3/uL 1.4 1.6 1.6 1.6  1.9 R   Monocytes Absolute 0.1 - 0.9 x10E3/uL 0.4 0.4 0.5 0.5     EOS (ABSOLUTE) 0.0 - 0.4 x10E3/uL 0.2 0.1 0.2 0.1     Basophils Absolute 0.0 - 0.2 x10E3/uL 0.1 0.1 0.1 0.1  0.1 R, CM   Immature Granulocytes Not Estab. % 1 0 1 1                  ________________________________________  EKG Marked bradycardia 48 bpm.  First-degree AV block.  ____________________________________________    ____________________________________________   INITIAL IMPRESSION / ASSESSMENT AND PLAN   As part of my medical decision making, I reviewed the following data within the electronic MEDICAL RECORD NUMBER   No acute findings in physical exam.         ____________________________________________   FINAL CLINICAL IMPRESSION Well exam    ED Discharge Orders          Ordered    EKG 12-Lead        01/21/23 1129             Note:  This document was prepared using Dragon voice recognition software and may include unintentional dictation errors.

## 2023-03-11 ENCOUNTER — Other Ambulatory Visit: Payer: Self-pay | Admitting: Physician Assistant

## 2023-03-11 DIAGNOSIS — N522 Drug-induced erectile dysfunction: Secondary | ICD-10-CM

## 2023-08-29 ENCOUNTER — Other Ambulatory Visit: Payer: Self-pay

## 2023-08-29 NOTE — Progress Notes (Signed)
RANDOM UDS PENDING WITH [employer] CLEARED.

## 2023-11-26 ENCOUNTER — Other Ambulatory Visit: Payer: Self-pay

## 2023-11-26 DIAGNOSIS — N522 Drug-induced erectile dysfunction: Secondary | ICD-10-CM

## 2023-11-26 MED ORDER — SILDENAFIL CITRATE 20 MG PO TABS: 20.0000 mg | ORAL_TABLET | Freq: Every day | ORAL | 11 refills | Status: AC | PRN

## 2024-02-23 ENCOUNTER — Ambulatory Visit: Payer: Self-pay

## 2024-02-23 ENCOUNTER — Other Ambulatory Visit: Payer: Self-pay | Admitting: Physician Assistant

## 2024-02-23 DIAGNOSIS — Z0000 Encounter for general adult medical examination without abnormal findings: Secondary | ICD-10-CM

## 2024-02-23 DIAGNOSIS — I10 Essential (primary) hypertension: Secondary | ICD-10-CM

## 2024-02-23 LAB — POCT URINALYSIS DIPSTICK
Bilirubin, UA: NEGATIVE
Blood, UA: NEGATIVE
Glucose, UA: NEGATIVE
Ketones, UA: NEGATIVE
Leukocytes, UA: NEGATIVE
Nitrite, UA: NEGATIVE
Protein, UA: POSITIVE — AB
Spec Grav, UA: 1.02 (ref 1.010–1.025)
Urobilinogen, UA: 0.2 U/dL
pH, UA: 6 (ref 5.0–8.0)

## 2024-02-25 LAB — CMP12+LP+TP+TSH+6AC+PSA+CBC…
ALT: 23 IU/L (ref 0–44)
AST: 27 IU/L (ref 0–40)
Albumin: 4.6 g/dL (ref 3.8–4.9)
Alkaline Phosphatase: 79 IU/L (ref 44–121)
BUN/Creatinine Ratio: 12 (ref 9–20)
BUN: 14 mg/dL (ref 6–24)
Basophils Absolute: 0.1 10*3/uL (ref 0.0–0.2)
Basos: 1 %
Bilirubin Total: 0.9 mg/dL (ref 0.0–1.2)
Calcium: 9.3 mg/dL (ref 8.7–10.2)
Chloride: 106 mmol/L (ref 96–106)
Chol/HDL Ratio: 3.3 ratio (ref 0.0–5.0)
Cholesterol, Total: 182 mg/dL (ref 100–199)
Creatinine, Ser: 1.14 mg/dL (ref 0.76–1.27)
EOS (ABSOLUTE): 0.1 10*3/uL (ref 0.0–0.4)
Eos: 2 %
Estimated CHD Risk: <0.5 times avg. (ref 0.0–1.0)
Free Thyroxine Index: 1.6 (ref 1.2–4.9)
GGT: 16 IU/L (ref 0–65)
Globulin, Total: 2.4 g/dL (ref 1.5–4.5)
Glucose: 91 mg/dL (ref 70–99)
HDL: 56 mg/dL (ref >39)
Hematocrit: 41.8 % (ref 37.5–51.0)
Hemoglobin: 13.7 g/dL (ref 13.0–17.7)
Immature Grans (Abs): 0 10*3/uL (ref 0.0–0.1)
Immature Granulocytes: 1 %
Iron: 105 ug/dL (ref 38–169)
LDH: 185 IU/L (ref 121–224)
LDL Chol Calc (NIH): 116 mg/dL — ABNORMAL HIGH (ref 0–99)
Lymphocytes Absolute: 1.7 10*3/uL (ref 0.7–3.1)
Lymphs: 28 %
MCH: 27.9 pg (ref 26.6–33.0)
MCHC: 32.8 g/dL (ref 31.5–35.7)
MCV: 85 fL (ref 79–97)
Monocytes Absolute: 0.4 10*3/uL (ref 0.1–0.9)
Monocytes: 7 %
Neutrophils Absolute: 3.6 10*3/uL (ref 1.4–7.0)
Neutrophils: 61 %
Phosphorus: 2.9 mg/dL (ref 2.8–4.1)
Platelets: 229 10*3/uL (ref 150–450)
Potassium: 4.4 mmol/L (ref 3.5–5.2)
Prostate Specific Ag, Serum: 1.3 ng/mL (ref 0.0–4.0)
RBC: 4.91 x10E6/uL (ref 4.14–5.80)
RDW: 11.8 % (ref 11.6–15.4)
Sodium: 142 mmol/L (ref 134–144)
T3 Uptake Ratio: 24 % (ref 24–39)
T4, Total: 6.6 ug/dL (ref 4.5–12.0)
TSH: 2.19 u[IU]/mL (ref 0.450–4.500)
Total Protein: 7 g/dL (ref 6.0–8.5)
Triglycerides: 54 mg/dL (ref 0–149)
Uric Acid: 4.6 mg/dL (ref 3.8–8.4)
VLDL Cholesterol Cal: 10 mg/dL (ref 5–40)
WBC: 6 10*3/uL (ref 3.4–10.8)
eGFR: 76 mL/min/{1.73_m2} (ref >59)

## 2024-03-01 ENCOUNTER — Ambulatory Visit: Payer: Self-pay | Admitting: Physician Assistant

## 2024-03-01 ENCOUNTER — Encounter: Payer: Self-pay | Admitting: Physician Assistant

## 2024-03-01 VITALS — BP 150/94 | HR 52 | Resp 12 | Ht 70.0 in | Wt 240.0 lb

## 2024-03-01 DIAGNOSIS — Z0000 Encounter for general adult medical examination without abnormal findings: Secondary | ICD-10-CM

## 2024-03-01 NOTE — Progress Notes (Signed)
 Pt presents today to complete physical, Pt concerned with BP. Pt is trying to come of BP medication and try natural ways.

## 2024-03-01 NOTE — Progress Notes (Signed)
 [facility] hours  ____________________________________________   None    (approximate)  I have reviewed the triage vital signs and the nursing notes.   HISTORY  Chief Complaint No chief complaint on file.   HPI Bryan Stephenson. is a 53 y.o. male patient presents for annual physical exam.  Patient was concerned as positive with hypertension medicine.  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   Knee pain 02/09/2021    Past Surgical History:  Procedure Laterality Date   COLONOSCOPY WITH PROPOFOL  N/A 09/24/2022   Procedure: COLONOSCOPY WITH PROPOFOL ;  Surgeon: Shane Darling, MD;  Location: [facility];  Service: Endoscopy;  Laterality: N/A;   FRACTURE SURGERY Right 1984   FEMUR FX   KNEE ARTHROSCOPY Left 2014   pins and screws   KNEE ARTHROSCOPY WITH MEDIAL MENISECTOMY Right 10/23/2017   Procedure: KNEE ARTHROSCOPY WITH MEDIAL MENISECTOMY;  Surgeon: Rande Bushy, MD;  Location: [facility] ORS;  Service: Orthopedics;  Laterality: Right;    Prior to Admission medications   Medication Sig Start Date End Date Taking? Authorizing Provider  bisoprolol -hydrochlorothiazide  (ZIAC ) 5-6.25 MG tablet TAKE 2 TABLETS BY MOUTH EVERY MORNING 02/23/24   Marcina Severe, PA-C  Multiple Vitamin (ONE-A-DAY MENS PO) Take 1 tablet by mouth daily.    [provider]  PARoxetine  (PAXIL ) 20 MG tablet TAKE 1 TABLET BY MOUTH DAILY AS NEEDED (3-4 HOURS PRIOR TO INTERCOURSE FOR PREMATURE EJACULATION). 05/30/22   Lawerence Pressman, MD  sildenafil  (REVATIO ) 20 MG tablet Take 1 tablet (20 mg total) by  mouth daily as needed (as needed for sexual activity). 11/26/23   Marcina Severe, PA-C    Allergies Bee venom and Penicillins  Family History  Problem Relation Age of Onset   Breast cancer Mother    Hyperlipidemia Father    Glaucoma Father    Lung cancer Maternal Grandfather    Diabetes Paternal Grandmother     Social History Social History   Tobacco Use   Smoking status: Never   Smokeless tobacco: Never  Vaping Use   Vaping status: Never Used  Substance Use Topics   Alcohol use: No   Drug use: No    Review of Systems Constitutional: No fever/chills Eyes: No visual changes. ENT: No sore throat. Cardiovascular: Denies chest pain. Respiratory: Denies shortness of breath. Gastrointestinal: No abdominal pain.  No nausea, no vomiting.  No diarrhea.  No constipation. Genitourinary: Negative for dysuria. Musculoskeletal: Negative for back pain. Skin: Negative for rash. Neurological: Negative for headaches, focal weakness or numbness.  ____________________________________________   PHYSICAL EXAM:  VITAL SIGNS: BP 150/94BP. 150/94. Data is abnormal. Taken on 03/01/24 11:28 AM 150/94BP. 150/94. Data is abnormal. Taken on 03/01/24 11:34 AM  Cuff Size LargeCuff Size. Large. Has comment. Taken on 03/01/24 11:28 AM --  Pulse Rate 56Pulse Rate. 56. Data is abnormal. Taken on 03/01/24 11:28 AM 52Pulse Rate. 52. Data is abnormal. Taken on 03/01/24 11:34 AM  Weight 240 lb (108.9 kg) --  Height 5\' 10"  (1.778 m) --  Resp 12 --  SpO2 98 %    Constitutional:  Alert and oriented. Well appearing and in no acute distress. Eyes: Conjunctivae are normal. PERRL. EOMI. Head: Atraumatic. Nose: No congestion/rhinnorhea. Mouth/Throat: Mucous membranes are moist.  Oropharynx non-erythematous. Neck: No stridor.  No cervical spine tenderness to palpation. Hematological/Lymphatic/Immunilogical: No cervical lymphadenopathy. Cardiovascular: Normal rate, regular rhythm. Grossly normal heart sounds.   Good peripheral circulation. Respiratory: Normal respiratory effort.  No retractions. Lungs CTAB. Gastrointestinal: Soft and nontender. No distention. No abdominal bruits. No CVA tenderness. Genitourinary: Deferred Musculoskeletal: No lower extremity tenderness nor edema.  No joint effusions. Neurologic:  Normal speech and language. No gross focal neurologic deficits are appreciated. No gait instability. Skin:  Skin is warm, dry and intact. No rash noted. Psychiatric: Mood and affect are normal. Speech and behavior are normal.  ____________________________________________   LABS ____        Component Ref Range & Units (hover) 7 d ago 1 yr ago 2 yr ago 3 yr ago 4 yr ago  Color, UA AMBER yellow yellow yellow Yellow  Clarity, UA CLEAR clear clear clear Clear  Glucose, UA Negative Negative Negative Negative Negative  Bilirubin, UA NEG neg negaative negative Negative  Ketones, UA NEG neg negative negative Negative  Spec Grav, UA 1.020 1.025 >=1.030 Abnormal  >=1.030 Abnormal  >=1.030 Abnormal   Blood, UA NEG neg negative negative Negative  pH, UA 6.0 5.5 5.5 5.5 5.5  Protein, UA Positive Abnormal  Negative Negative Negative Negative  Comment: TRACE -+  Urobilinogen, UA 0.2 0.2 0.2 0.2 0.2  Nitrite, UA NEG neg negative negative Negative  Leukocytes, UA Negative Negative Negative Negative Negative  Appearance  dark  medium   Odor             Specimen Collected: 02/23/24 11:08 Last Resulted: 02/23/24 11:08      Lab Flowsheet      Order Details      View Encounter      Lab and Collection Details      Routing      Result History    View All Conversations on this Encounter    Result Care Coordination   Patient Communication   Add Comments   Not seen Back to Top   Other Results from 02/23/2024   Contains abnormal data CMP12+LP+TP+TSH+6AC+PSA+CBC. Order: [id number]  Status: Final result     Next appt: None     Dx: Annual physical exam     Test Result Released: No  (inaccessible in MyChart)   0 Result Notes           Component Ref Range & Units (hover) 7 d ago (02/23/24) 1 yr ago (01/14/23) 2 yr ago (12/25/21) 3 yr ago (11/17/20) 4 yr ago (10/01/19) 6 yr ago (10/22/17) 6 yr ago (10/22/17)  Glucose 91 90 85 87 R 89 R 96 R   Uric Acid 4.6 5.4 CM 5.8 CM 5.5 CM 6.6 CM    Comment:            Therapeutic target for gout patients: <6.0  BUN 14 14 19 16 23 16  R   Creatinine, Ser 1.14 1.22 1.25 1.22 1.26 1.18 R   eGFR 76 71 69      BUN/Creatinine Ratio 12 11 15 13 18     Sodium 142 141 143 139 138 140 R   Potassium 4.4 3.9 4.6 4.3 3.9 4.1 R   Chloride 106 106 104 103 103 105 R   Calcium 9.3 9.3 9.8 9.7 9.2 9.1 R   Phosphorus 2.9 2.3 Low  4.3  High  3.9 3.4    Total Protein 7.0 6.4 7.0 7.2 6.8    Albumin 4.6 4.2 4.7 5.0 High  4.5 R    Globulin, Total 2.4 2.2 2.3 2.2 2.3    Bilirubin Total 0.9 0.9 1.2 1.0 1.0    Alkaline Phosphatase 79 70 67 65 57 R    LDH 185 199 199 215 243 High     AST 27 29 29  38 28    ALT 23 25 26 23 20     GGT 16 16 15 14 16     Iron 105 113 118 90 110    Cholesterol, Total 182 179 208 High  207 High  204 High     Triglycerides 54 86 67 42 67    HDL 56 59 63 70 58    VLDL Cholesterol Cal 10 16 12 8 12     LDL Chol Calc (NIH) 116 High  104 High  133 High  129 High  134 High     Chol/HDL Ratio 3.3 3.0 CM 3.3 CM 3.0 CM 3.5 CM    Comment:                                   T. Chol/HDL Ratio                                             Men  Women                               1/2 Avg.Risk  3.4    3.3                                   Avg.Risk  5.0    4.4                                2X Avg.Risk  9.6    7.1                                3X Avg.Risk 23.4   11.0  Estimated CHD Risk  < 0.5  < 0.5 CM  < 0.5 CM  < 0.5 CM 0.6 CM    Comment: The CHD Risk is based on the T. Chol/HDL ratio. Other factors affect CHD Risk such as hypertension, smoking, diabetes, severe obesity, and family history of premature CHD.  TSH 2.190 3.850 3.860 3.140  2.700    T4, Total 6.6 5.9 7.0 6.3 6.0    T3 Uptake Ratio 24 25 24 24 25     Free Thyroxine Index 1.6 1.5 1.7 1.5 1.5    Prostate Specific Ag, Serum 1.3 1.3 CM 1.3 CM 1.2 CM 1.2 CM    Comment: Roche ECLIA methodology. According to the American U[employer] Association, Serum PSA should decrease and remain at undetectable levels after radical prostatectomy. The AUA defines biochemical recurrence as an initial PSA value 0.2 ng/mL or greater followed by a subsequent confirmatory PSA value 0.2 ng/mL or greater. Values obtained with different assay methods or kits cannot be used interchangeably. Results  cannot be interpreted as absolute evidence of the presence or absence of malignant disease.  WBC 6.0 5.2 5.7 6.3 6.1  7.1 R  RBC 4.91 4.77 5.28 5.21 4.84  4.94 R  Hemoglobin 13.7 13.1 14.4 14.3 13.5  13.7 R  Hematocrit 41.8 39.6 44.6 43.5 40.6  41.7 R  MCV 85 83 85 84 84  84.4 R  MCH 27.9 27.5 27.3 27.4 27.9  27.8 R  MCHC 32.8 33.1 32.3 32.9 33.3  32.9 R  RDW 11.8 11.9 11.5 Low  11.5 Low  11.7  12.5 R  Platelets 229 219 245 264 242  239 R  Neutrophils 61 61 62 61 61  63 R  Lymphs 28 27 28 26 27     Monocytes 7 7 7 7 8     Eos 2 3 2 4 2     Basos 1 1 1 1 1     Neutrophils Absolute 3.6 3.2 3.5 3.9 3.7  4.4 R  Lymphocytes Absolute 1.7 1.4 1.6 1.6 1.6  1.9 R  Monocytes Absolute 0.4 0.4 0.4 0.5 0.5    EOS (ABSOLUTE) 0.1 0.2 0.1 0.2 0.1    Basophils Absolute 0.1 0.1 0.1 0.1 0.1  0.1 R, CM  Immature Granulocytes 1 1 0 1 1    Immature Grans (Abs) 0.0 0.0 0.0 0.0 0.0               ________________________________________  EKG  Sinus bradycardia 53 bpm ____________________________________________    ____________________________________________   INITIAL IMPRESSION / ASSESSMENT AND PLAN As part of my medical decision making, I reviewed the following data within the electronic MEDICAL RECORD NUMBER Notes from prior ED visits and [place] Controlled Substance Database      No acute findings on  physical exam, EKG, labs.     ____________________________________________   FINAL CLINICAL IMPRESSION  Well exam ED Discharge Orders     None        Note:  This document was prepared using Dragon voice recognition software and may include unintentional dictation errors.
